# Patient Record
Sex: Female | Born: 1955 | Race: Asian | Hispanic: No | Smoking: Never smoker
Health system: Southern US, Community
[De-identification: ages and names within clinical notes are randomized; demographics above are authoritative.]

## PROBLEM LIST (undated history)

## (undated) HISTORY — PX: BREAST SURGERY: SHX581

---

## 2015-05-04 ENCOUNTER — Emergency Department (HOSPITAL_COMMUNITY): Payer: 59

## 2015-05-04 ENCOUNTER — Emergency Department (HOSPITAL_COMMUNITY)
Admission: EM | Admit: 2015-05-04 | Discharge: 2015-05-05 | Disposition: A | Discharge status: Home / Self Care | Payer: 59 | Attending: Emergency Medicine | Admitting: Emergency Medicine

## 2015-05-04 ENCOUNTER — Encounter (HOSPITAL_COMMUNITY): Payer: Self-pay | Admitting: *Deleted

## 2015-05-04 DIAGNOSIS — R51 Headache: Secondary | ICD-10-CM | POA: Insufficient documentation

## 2015-05-04 DIAGNOSIS — R110 Nausea: Secondary | ICD-10-CM | POA: Insufficient documentation

## 2015-05-04 DIAGNOSIS — R519 Headache, unspecified: Secondary | ICD-10-CM

## 2015-05-04 DIAGNOSIS — Z79899 Other long term (current) drug therapy: Secondary | ICD-10-CM | POA: Insufficient documentation

## 2015-05-04 DIAGNOSIS — R202 Paresthesia of skin: Secondary | ICD-10-CM | POA: Insufficient documentation

## 2015-05-04 MED ORDER — KETOROLAC TROMETHAMINE 60 MG/2ML IM SOLN
30.0000 mg | Freq: Once | INTRAMUSCULAR | Status: AC
  Administered 2015-05-04: 30 mg via INTRAMUSCULAR
  Filled 2015-05-04: qty 2

## 2015-05-04 MED ORDER — PROMETHAZINE HCL 25 MG/ML IJ SOLN
12.5000 mg | Freq: Once | INTRAMUSCULAR | Status: AC
  Administered 2015-05-04: 12.5 mg via INTRAVENOUS
  Filled 2015-05-04: qty 1

## 2015-05-04 MED ORDER — DIPHENHYDRAMINE HCL 50 MG/ML IJ SOLN
25.0000 mg | Freq: Once | INTRAMUSCULAR | Status: AC
  Administered 2015-05-04: 25 mg via INTRAVENOUS
  Filled 2015-05-04: qty 1

## 2015-05-04 MED ORDER — PROCHLORPERAZINE MALEATE 5 MG PO TABS: 5.0000 mg | ORAL_TABLET | Freq: Two times a day (BID) | ORAL | Status: DC | PRN

## 2015-05-04 NOTE — Discharge Instructions (Signed)
Your CT scan was normal today. Your headache is likely a migraine. Your symptoms resolved with the medications. Please follow-up with your primary care provider within one week for further evaluation and management of your symptoms. I will give you a prescription for compazine which you may take as needed for your headache.   Please obtain all of your results from medical records or have your doctors office obtain the results - share them with your doctor - you should be seen at your doctors office in the next 2 days. Call today to arrange your follow up. Take the medications as prescribed. Please review all of the medicines and only take them if you do not have an allergy to them. Please be aware that if you are taking birth control pills, taking other prescriptions, ESPECIALLY ANTIBIOTICS may make the birth control ineffective - if this is the case, either do not engage in sexual activity or use alternative methods of birth control such as condoms until you have finished the medicine and your family doctor says it is OK to restart them. If you are on a blood thinner such as COUMADIN, be aware that any other medicine that you take may cause the coumadin to either work too much, or not enough - you should have your coumadin level rechecked in next 7 days if this is the case.  ?  It is also a possibility that you have an allergic reaction to any of the medicines that you have been prescribed - Everybody reacts differently to medications and while MOST people have no trouble with most medicines, you may have a reaction such as nausea, vomiting, rash, swelling, shortness of breath. If this is the case, please stop taking the medicine immediately and contact your physician.  ?  You should return to the ER if you develop severe or worsening symptoms.

## 2015-05-04 NOTE — ED Notes (Signed)
Pt complains of pain in her left rear head radiating to her left eye for the past week. Pt states she has felt less sensation in the left side of her face for the past 2 days. Pt states she hit the back of her head on a metal shelf 2 weeks ago. Pt denies nausea/vomiting/loss of consciousness.

## 2015-05-04 NOTE — ED Provider Notes (Signed)
CSN: [id number]     Arrival date & time 05/04/15  1937 History   First MD Initiated Contact with Patient 05/04/15 2029     Chief Complaint  Patient presents with  . Headache  . Numbness    HPI   Julie Terrell is an 59 y.o. female with no significant PMH who presents to the ED for evaluation of headache. Pt speaks [place] and is accompanied by her sister who acts as Nurse, learning disability. She states that she has had a Lparietal headache that comes and goes for the past week. She states that the pain radiates to her L temple. She states that she also feels numbness and tingling in the L side of her face. She states sometimes she feels like she has blurred vision in her L eye. Denies weakness. Denies dizziness. She states she might have hit her head on a shelf at work but does not remember for sure. Denies LOC. States she has had headaches in the past and they are usually more diffuse around her head and have never been this bad. She states she has tried tylenol with no relief. Denies chest pain, SOB, fever, chills, N/V.  History reviewed. No pertinent past medical history. History reviewed. No pertinent past surgical history. No family history on file. Social History  Substance Use Topics  . Smoking status: Never Smoker   . Smokeless tobacco: None  . Alcohol Use: No   OB History    No data available     Review of Systems  All other systems reviewed and are negative.     Allergies  Review of patient's allergies indicates no known allergies.  Home Medications   Prior to Admission medications   Medication Sig Start Date End Date Taking? Authorizing Provider  aspirin-acetaminophen-caffeine (EXCEDRIN MIGRAINE) [phone] MG tablet Take 1 tablet by mouth every 6 (six) hours as needed for headache or migraine.   Yes Historical Provider, MD  cetirizine (ZYRTEC) 10 MG tablet Take 10 mg by mouth daily.   Yes Historical Provider, MD  ibuprofen (ADVIL,MOTRIN) 200 MG tablet Take 200 mg by mouth every 6  (six) hours as needed for headache or moderate pain.   Yes Historical Provider, MD   BP 141/81 mmHg  Pulse 72  Resp 18  SpO2 100% Physical Exam  Constitutional: She is oriented to person, place, and time. No distress.  HENT:  Head: Atraumatic.  Right Ear: External ear normal.  Left Ear: External ear normal.  Nose: Nose normal.  Mouth/Throat: Oropharynx is clear and moist. No oropharyngeal exudate.  Eyes: Conjunctivae and EOM are normal. Pupils are equal, round, and reactive to light.  Neck: Normal range of motion. Neck supple.  Cardiovascular: Normal rate, regular rhythm, normal heart sounds and intact distal pulses.   Pulmonary/Chest: Effort normal and breath sounds normal. No respiratory distress. She has no wheezes.  Abdominal: Soft. Bowel sounds are normal. She exhibits no distension. There is no tenderness.  Musculoskeletal: Normal range of motion. She exhibits no edema or tenderness.  Neurological: She is alert and oriented to person, place, and time. She has normal strength. She displays no tremor. She displays a negative Romberg sign. Coordination and gait normal.  Pt reports decreased sensation on L side of face. Also endorses paresthesias. Cranial nerves are otherwise intact. No facial droop.   Skin: Skin is warm and dry. She is not diaphoretic.  Psychiatric: She has a normal mood and affect.  Nursing note and vitals reviewed.   ED Course  Procedures (including  critical care time) Labs Review Labs Reviewed - No data to display  Imaging Review Ct Head Wo Contrast  05/04/2015  CLINICAL DATA:  Headaches with facial numbness. EXAM: CT HEAD WITHOUT CONTRAST TECHNIQUE: Contiguous axial images were obtained from the base of the skull through the vertex without intravenous contrast. COMPARISON:  None. FINDINGS: There is no evidence of mass effect, midline shift or extra-axial fluid collections. There is no evidence of a space-occupying lesion or intracranial hemorrhage. There is  no evidence of a cortical-based area of acute infarction. The ventricles and sulci are appropriate for the patient's age. The basal cisterns are patent. Visualized portions of the orbits are unremarkable. The visualized portions of the paranasal sinuses and mastoid air cells are unremarkable. The osseous structures are unremarkable. IMPRESSION: Normal CT of the brain without intravenous contrast. Electronically Signed   By: Elige KoHetal  Patel   On: 05/04/2015 21:17   I have personally reviewed and evaluated these images and lab results as part of my medical decision-making.   EKG Interpretation None      MDM   Final diagnoses:  Acute nonintractable headache, unspecified headache type    Will give headache cocktail. Vitals unremarkable. No meningismus. Neuro exam reveals some decreased sensation to R side of face otherwise nonfocal. I suspect migraine. However given new character of headache with new symptoms will get CT head to r/o other acute pathology.  Pt reports relief with benadryl and phenergan. CT unremarkable. PT is nontoxic, nonfocal neuro exam, VSS. Stable for discharge.  As pt was being discharged she told RN that pain was back. Will give IM toradol here. Will give rx for compazine. Instructed to f/u with pcp for further eval and management. ER return precautions given. Pt verbalized understanding.   Carlene CoriaSerena Y Lashandra Arauz, PA-C 05/05/15 1443  Lorre NickAnthony Allen, MD 05/05/15 [phone]

## 2015-05-05 NOTE — ED Notes (Signed)
Patient c/o left side blurred vision and left side headache. No neuro deficits noted.  Patient ambulating with a steady gait.  PA notified and order received for additional pain medication.

## 2017-12-24 ENCOUNTER — Other Ambulatory Visit: Payer: Self-pay | Admitting: Family Medicine

## 2017-12-24 ENCOUNTER — Ambulatory Visit
Admission: RE | Admit: 2017-12-24 | Discharge: 2017-12-24 | Disposition: A | Discharge status: Home / Self Care | Payer: BLUE CROSS/BLUE SHIELD | Source: Ambulatory Visit | Attending: Family Medicine | Admitting: Family Medicine

## 2017-12-24 DIAGNOSIS — R05 Cough: Secondary | ICD-10-CM

## 2017-12-24 DIAGNOSIS — R053 Chronic cough: Secondary | ICD-10-CM

## 2018-01-22 ENCOUNTER — Ambulatory Visit
Admission: RE | Admit: 2018-01-22 | Discharge: 2018-01-22 | Disposition: A | Discharge status: Home / Self Care | Payer: BLUE CROSS/BLUE SHIELD | Source: Ambulatory Visit | Attending: Family Medicine | Admitting: Family Medicine

## 2018-01-22 ENCOUNTER — Other Ambulatory Visit: Payer: Self-pay | Admitting: Family Medicine

## 2018-01-22 DIAGNOSIS — R9389 Abnormal findings on diagnostic imaging of other specified body structures: Secondary | ICD-10-CM

## 2018-01-22 DIAGNOSIS — R05 Cough: Secondary | ICD-10-CM

## 2018-01-22 DIAGNOSIS — R053 Chronic cough: Secondary | ICD-10-CM

## 2018-01-28 ENCOUNTER — Other Ambulatory Visit: Payer: Self-pay | Admitting: Family Medicine

## 2018-01-28 DIAGNOSIS — R9389 Abnormal findings on diagnostic imaging of other specified body structures: Secondary | ICD-10-CM

## 2018-02-06 ENCOUNTER — Ambulatory Visit
Admission: RE | Admit: 2018-02-06 | Discharge: 2018-02-06 | Disposition: A | Discharge status: Home / Self Care | Payer: BLUE CROSS/BLUE SHIELD | Source: Ambulatory Visit | Attending: Family Medicine | Admitting: Family Medicine

## 2018-02-06 DIAGNOSIS — R9389 Abnormal findings on diagnostic imaging of other specified body structures: Secondary | ICD-10-CM

## 2018-03-19 ENCOUNTER — Other Ambulatory Visit: Payer: Self-pay | Admitting: Family Medicine

## 2018-03-19 DIAGNOSIS — Z1231 Encounter for screening mammogram for malignant neoplasm of breast: Secondary | ICD-10-CM

## 2018-04-29 ENCOUNTER — Ambulatory Visit: Payer: BLUE CROSS/BLUE SHIELD

## 2019-01-30 IMAGING — CR DG CHEST 2V
2 series · 2 of 2 positions shown · non-contrast
Comparison: Chest radiograph 12/24/2017.

CLINICAL DATA: 62-year-old female with persistent cough for 3 weeks
or longer. Nonsmoker.

EXAM:
CHEST - 2 VIEW

[w chest pa]
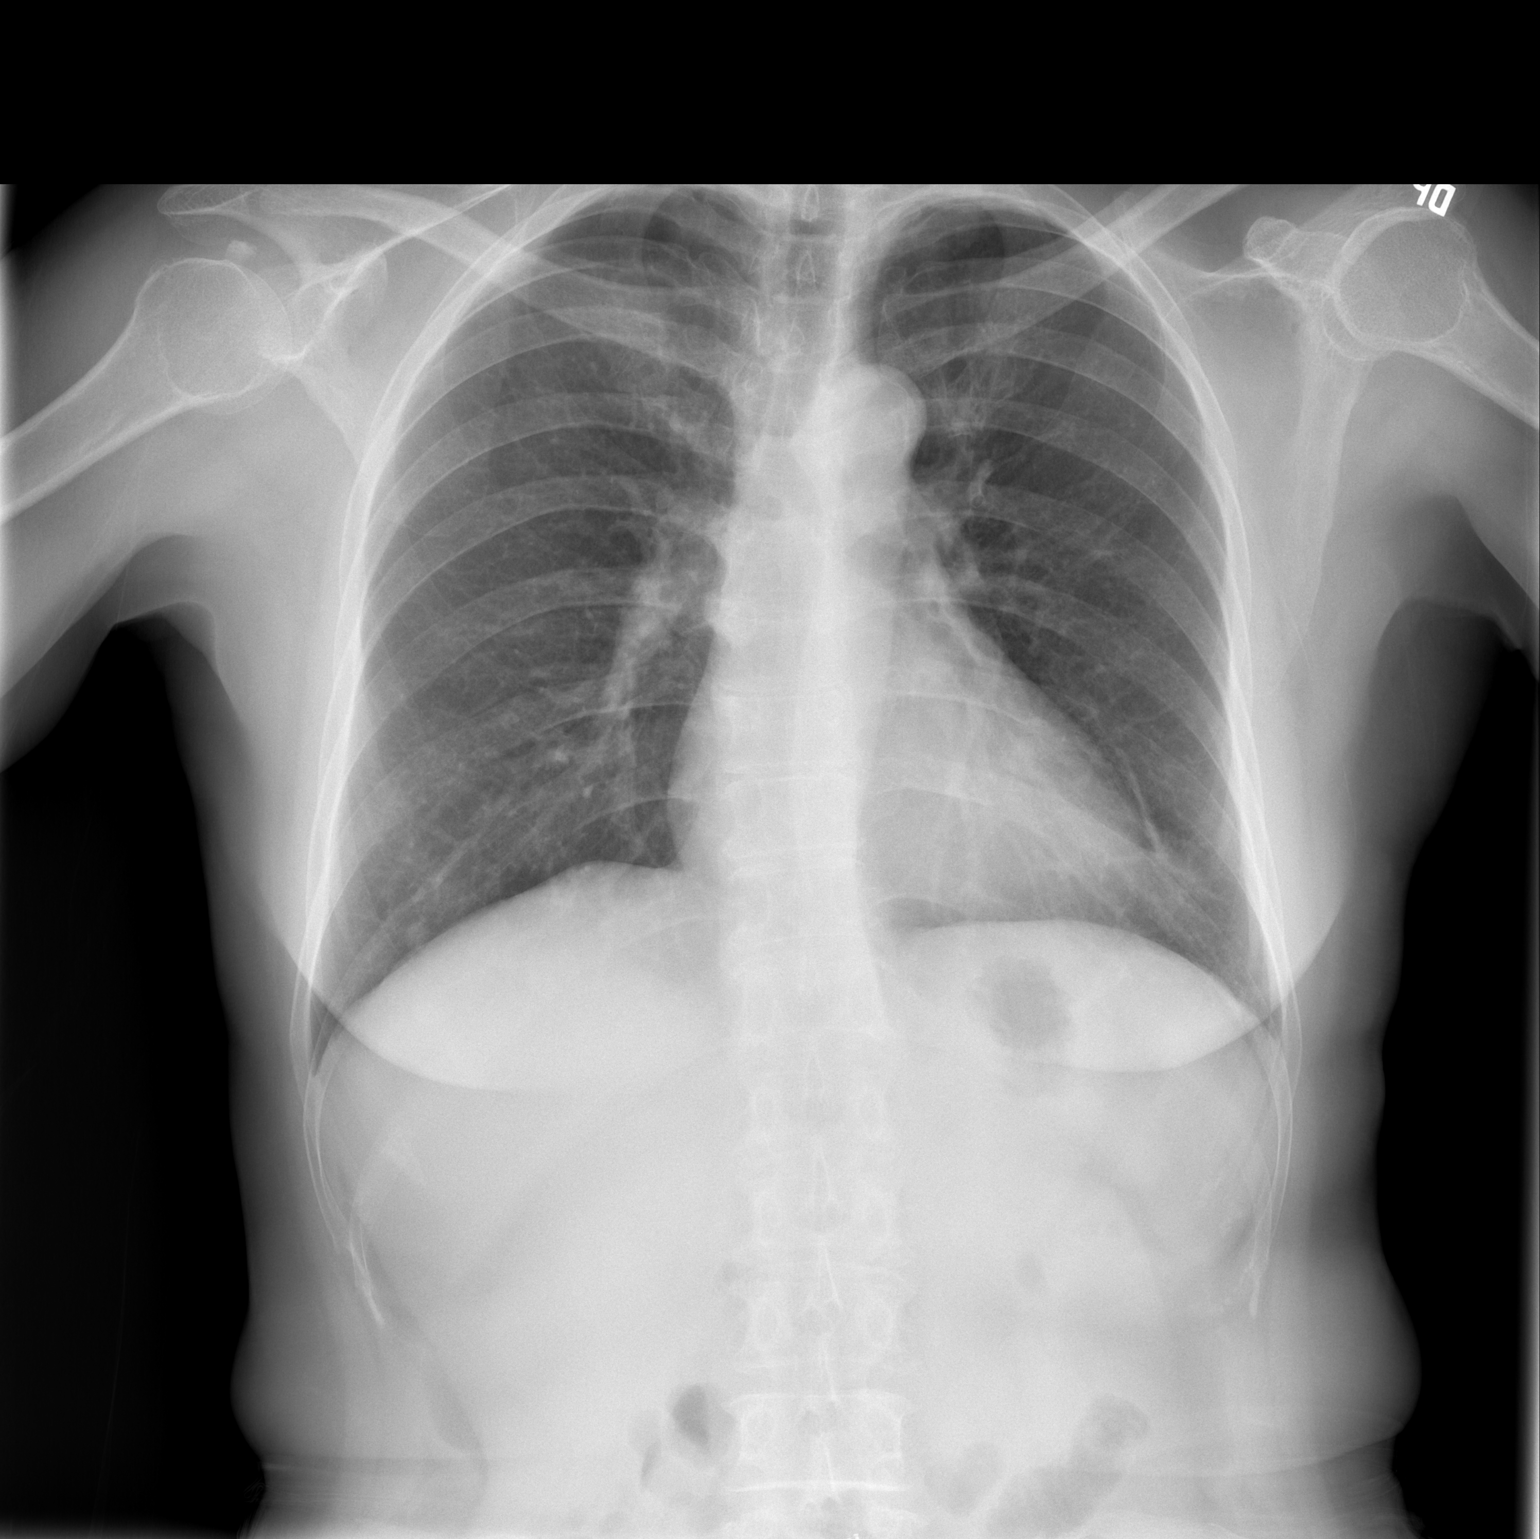

[w chest lat]
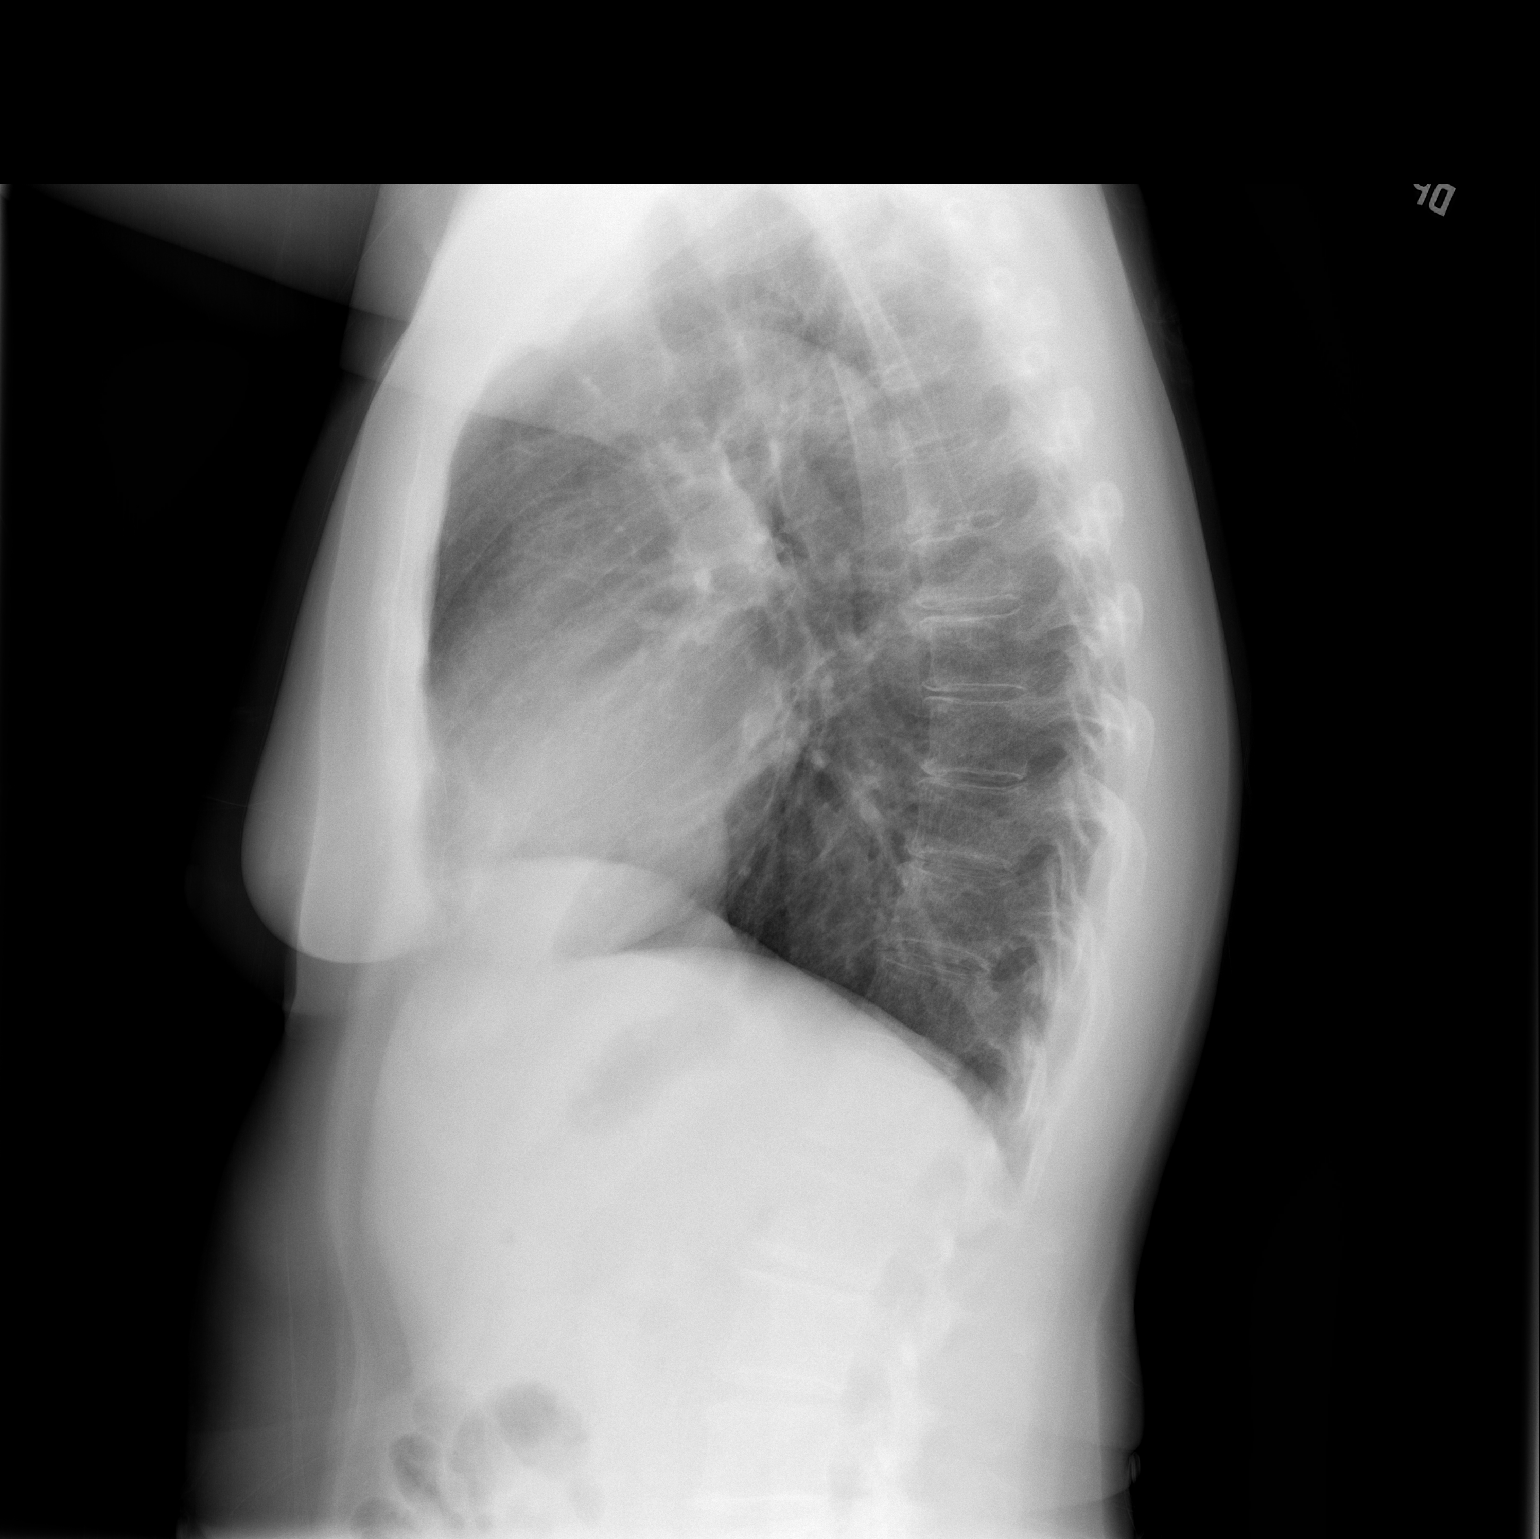

[2 of 2 positions shown; findings below may reference images not displayed]

FINDINGS: Unchanged patchy lingula and small nodular opacity at the right lung
base since [REDACTED]. Underlying lung volumes and mediastinal contours
remain normal. No superimposed pneumothorax, pulmonary edema,
pleural effusion, or new pulmonary opacity. No definite
bronchiectasis. Visualized tracheal air column is within normal
limits.

No acute osseous abnormality identified. Negative visible bowel gas
pattern.
IMPRESSION: 1. Unchanged mild nonspecific lung base opacity since [REDACTED] which may
be related to the middle lobes, raising the possibility of chronic
atypical infection in this clinical setting.
Chest CT (noncontrast should suffice) recommended to further
characterize.
2. No new cardiopulmonary abnormality.

## 2021-01-07 ENCOUNTER — Observation Stay (HOSPITAL_BASED_OUTPATIENT_CLINIC_OR_DEPARTMENT_OTHER)
Admission: EM | Admit: 2021-01-07 | Discharge: 2021-01-09 | Disposition: A | Discharge status: Home / Self Care | Payer: BLUE CROSS/BLUE SHIELD | Attending: General Surgery | Admitting: General Surgery

## 2021-01-07 ENCOUNTER — Other Ambulatory Visit: Payer: Self-pay

## 2021-01-07 ENCOUNTER — Encounter (HOSPITAL_BASED_OUTPATIENT_CLINIC_OR_DEPARTMENT_OTHER): Payer: Self-pay

## 2021-01-07 DIAGNOSIS — Z20822 Contact with and (suspected) exposure to covid-19: Secondary | ICD-10-CM | POA: Diagnosis not present

## 2021-01-07 DIAGNOSIS — K353 Acute appendicitis with localized peritonitis, without perforation or gangrene: Secondary | ICD-10-CM | POA: Diagnosis not present

## 2021-01-07 DIAGNOSIS — K3580 Unspecified acute appendicitis: Secondary | ICD-10-CM | POA: Diagnosis present

## 2021-01-07 DIAGNOSIS — R1031 Right lower quadrant pain: Secondary | ICD-10-CM | POA: Diagnosis present

## 2021-01-07 DIAGNOSIS — K37 Unspecified appendicitis: Secondary | ICD-10-CM | POA: Diagnosis present

## 2021-01-07 DIAGNOSIS — Z7982 Long term (current) use of aspirin: Secondary | ICD-10-CM | POA: Insufficient documentation

## 2021-01-07 NOTE — ED Triage Notes (Signed)
Pt is present for sharp RLQ abd pain that started this afternoon. Pt states, "When I get the sharp pain, I feel like I have to go to the bathroom but I cant". Denies N/V/D. Last BM this afternoon. Sister is assisting with interpreting, pt speaks [place].

## 2021-01-08 ENCOUNTER — Encounter (HOSPITAL_COMMUNITY)
Admission: EM | Disposition: A | Discharge status: Home / Self Care | Payer: Self-pay | Source: Home / Self Care | Attending: Emergency Medicine

## 2021-01-08 ENCOUNTER — Encounter (HOSPITAL_BASED_OUTPATIENT_CLINIC_OR_DEPARTMENT_OTHER): Payer: Self-pay | Admitting: Radiology

## 2021-01-08 ENCOUNTER — Emergency Department (HOSPITAL_COMMUNITY): Payer: BLUE CROSS/BLUE SHIELD | Admitting: Certified Registered Nurse Anesthetist

## 2021-01-08 ENCOUNTER — Emergency Department (HOSPITAL_BASED_OUTPATIENT_CLINIC_OR_DEPARTMENT_OTHER): Payer: BLUE CROSS/BLUE SHIELD

## 2021-01-08 DIAGNOSIS — Z20822 Contact with and (suspected) exposure to covid-19: Secondary | ICD-10-CM | POA: Diagnosis not present

## 2021-01-08 DIAGNOSIS — K3580 Unspecified acute appendicitis: Secondary | ICD-10-CM | POA: Diagnosis present

## 2021-01-08 DIAGNOSIS — K353 Acute appendicitis with localized peritonitis, without perforation or gangrene: Secondary | ICD-10-CM | POA: Diagnosis not present

## 2021-01-08 DIAGNOSIS — R1031 Right lower quadrant pain: Secondary | ICD-10-CM | POA: Diagnosis present

## 2021-01-08 DIAGNOSIS — Z7982 Long term (current) use of aspirin: Secondary | ICD-10-CM | POA: Diagnosis not present

## 2021-01-08 DIAGNOSIS — K37 Unspecified appendicitis: Secondary | ICD-10-CM | POA: Diagnosis present

## 2021-01-08 HISTORY — PX: LAPAROSCOPIC APPENDECTOMY: SHX408

## 2021-01-08 LAB — COMPREHENSIVE METABOLIC PANEL
ALT: 29 U/L (ref 0–44)
AST: 22 U/L (ref 15–41)
Albumin: 4.3 g/dL (ref 3.5–5.0)
Alkaline Phosphatase: 68 U/L (ref 38–126)
Anion gap: 10 (ref 5–15)
BUN: 14 mg/dL (ref 8–23)
CO2: 27 mmol/L (ref 22–32)
Calcium: 8.8 mg/dL — ABNORMAL LOW (ref 8.9–10.3)
Chloride: 101 mmol/L (ref 98–111)
Creatinine, Ser: 0.67 mg/dL (ref 0.44–1.00)
GFR, Estimated: >60 mL/min (ref >60)
Glucose, Bld: 114 mg/dL — ABNORMAL HIGH (ref 70–99)
Potassium: 3.2 mmol/L — ABNORMAL LOW (ref 3.5–5.1)
Sodium: 138 mmol/L (ref 135–145)
Total Bilirubin: 0.7 mg/dL (ref 0.3–1.2)
Total Protein: 7.4 g/dL (ref 6.5–8.1)

## 2021-01-08 LAB — URINALYSIS, ROUTINE W REFLEX MICROSCOPIC
Bilirubin Urine: NEGATIVE
Glucose, UA: NEGATIVE mg/dL
Hgb urine dipstick: NEGATIVE
Ketones, ur: NEGATIVE mg/dL
Leukocytes,Ua: NEGATIVE
Nitrite: NEGATIVE
Protein, ur: NEGATIVE mg/dL
Specific Gravity, Urine: 1.009 (ref 1.005–1.030)
pH: 5.5 (ref 5.0–8.0)

## 2021-01-08 LAB — CBC
HCT: 35.6 % — ABNORMAL LOW (ref 36.0–46.0)
Hemoglobin: 12.2 g/dL (ref 12.0–15.0)
MCH: 25.1 pg — ABNORMAL LOW (ref 26.0–34.0)
MCHC: 34.3 g/dL (ref 30.0–36.0)
MCV: 73.1 fL — ABNORMAL LOW (ref 80.0–100.0)
Platelets: 266 10*3/uL (ref 150–400)
RBC: 4.87 MIL/uL (ref 3.87–5.11)
RDW: 14.2 % (ref 11.5–15.5)
WBC: 14.9 10*3/uL — ABNORMAL HIGH (ref 4.0–10.5)
nRBC: 0 % (ref 0.0–0.2)

## 2021-01-08 LAB — RESP PANEL BY RT-PCR (FLU A&B, COVID) ARPGX2
Influenza A by PCR: NEGATIVE
Influenza B by PCR: NEGATIVE
SARS Coronavirus 2 by RT PCR: NEGATIVE

## 2021-01-08 LAB — HIV ANTIBODY (ROUTINE TESTING W REFLEX): HIV Screen 4th Generation wRfx: NONREACTIVE

## 2021-01-08 LAB — LIPASE, BLOOD: Lipase: 56 U/L — ABNORMAL HIGH (ref 11–51)

## 2021-01-08 SURGERY — APPENDECTOMY, LAPAROSCOPIC
Anesthesia: General | Site: Abdomen

## 2021-01-08 MED ORDER — ENOXAPARIN SODIUM 40 MG/0.4ML IJ SOSY
40.0000 mg | PREFILLED_SYRINGE | INTRAMUSCULAR | Status: DC
  Administered 2021-01-09: 40 mg via SUBCUTANEOUS
  Filled 2021-01-08: qty 0.4

## 2021-01-08 MED ORDER — PHENYLEPHRINE 40 MCG/ML (10ML) SYRINGE FOR IV PUSH (FOR BLOOD PRESSURE SUPPORT)
PREFILLED_SYRINGE | INTRAVENOUS | Status: DC | PRN
  Administered 2021-01-08 (×3): 80 ug via INTRAVENOUS

## 2021-01-08 MED ORDER — LIDOCAINE 2% (20 MG/ML) 5 ML SYRINGE
INTRAMUSCULAR | Status: DC | PRN
  Administered 2021-01-08: 50 mg via INTRAVENOUS

## 2021-01-08 MED ORDER — MORPHINE SULFATE (PF) 2 MG/ML IV SOLN: 1.0000 mg | INTRAVENOUS | Status: DC | PRN

## 2021-01-08 MED ORDER — BUPIVACAINE-EPINEPHRINE 0.25% -1:200000 IJ SOLN
INTRAMUSCULAR | Status: DC | PRN
  Administered 2021-01-08: 6 mL

## 2021-01-08 MED ORDER — LACTATED RINGERS IV SOLN: INTRAVENOUS | Status: DC

## 2021-01-08 MED ORDER — ACETAMINOPHEN 500 MG PO TABS
1000.0000 mg | ORAL_TABLET | Freq: Four times a day (QID) | ORAL | Status: DC
  Administered 2021-01-08 – 2021-01-09 (×4): 1000 mg via ORAL
  Filled 2021-01-08 (×5): qty 2

## 2021-01-08 MED ORDER — FENTANYL CITRATE (PF) 100 MCG/2ML IJ SOLN
INTRAMUSCULAR | Status: AC
  Filled 2021-01-08: qty 2

## 2021-01-08 MED ORDER — HEMOSTATIC AGENTS (NO CHARGE) OPTIME
TOPICAL | Status: DC | PRN
  Administered 2021-01-08: 1

## 2021-01-08 MED ORDER — FENTANYL CITRATE (PF) 100 MCG/2ML IJ SOLN
25.0000 ug | INTRAMUSCULAR | Status: DC | PRN
  Administered 2021-01-08 (×2): 25 ug via INTRAVENOUS

## 2021-01-08 MED ORDER — FENTANYL CITRATE (PF) 250 MCG/5ML IJ SOLN
INTRAMUSCULAR | Status: DC | PRN
  Administered 2021-01-08: 50 ug via INTRAVENOUS
  Administered 2021-01-08: 100 ug via INTRAVENOUS

## 2021-01-08 MED ORDER — DEXAMETHASONE SODIUM PHOSPHATE 10 MG/ML IJ SOLN
INTRAMUSCULAR | Status: DC | PRN
  Administered 2021-01-08: 10 mg via INTRAVENOUS

## 2021-01-08 MED ORDER — SODIUM CHLORIDE 0.9 % IV SOLN
1.0000 g | Freq: Once | INTRAVENOUS | Status: AC
  Administered 2021-01-08: 1 g via INTRAVENOUS
  Filled 2021-01-08: qty 10

## 2021-01-08 MED ORDER — CEFAZOLIN SODIUM-DEXTROSE 2-4 GM/100ML-% IV SOLN
2.0000 g | Freq: Once | INTRAVENOUS | Status: AC
  Administered 2021-01-08: 2 g via INTRAVENOUS
  Filled 2021-01-08 (×2): qty 100

## 2021-01-08 MED ORDER — MORPHINE SULFATE (PF) 4 MG/ML IV SOLN
4.0000 mg | Freq: Once | INTRAVENOUS | Status: AC
  Administered 2021-01-08: 4 mg via INTRAVENOUS
  Filled 2021-01-08: qty 1

## 2021-01-08 MED ORDER — ONDANSETRON HCL 4 MG/2ML IJ SOLN
INTRAMUSCULAR | Status: DC | PRN
  Administered 2021-01-08: 4 mg via INTRAVENOUS

## 2021-01-08 MED ORDER — METRONIDAZOLE 500 MG/100ML IV SOLN
500.0000 mg | Freq: Three times a day (TID) | INTRAVENOUS | Status: DC
  Administered 2021-01-08: 500 mg via INTRAVENOUS
  Filled 2021-01-08: qty 100

## 2021-01-08 MED ORDER — ACETAMINOPHEN 500 MG PO TABS
1000.0000 mg | ORAL_TABLET | Freq: Once | ORAL | Status: DC
  Filled 2021-01-08: qty 2

## 2021-01-08 MED ORDER — SODIUM CHLORIDE 0.9 % IR SOLN
Status: DC | PRN
  Administered 2021-01-08: 1000 mL

## 2021-01-08 MED ORDER — DIPHENHYDRAMINE HCL 50 MG/ML IJ SOLN: 12.5000 mg | Freq: Four times a day (QID) | INTRAMUSCULAR | Status: DC | PRN

## 2021-01-08 MED ORDER — METHOCARBAMOL 500 MG PO TABS: 500.0000 mg | ORAL_TABLET | Freq: Four times a day (QID) | ORAL | Status: DC | PRN

## 2021-01-08 MED ORDER — PROMETHAZINE HCL 25 MG/ML IJ SOLN
6.2500 mg | Freq: Once | INTRAMUSCULAR | Status: AC
  Administered 2021-01-08: 6.25 mg via INTRAVENOUS

## 2021-01-08 MED ORDER — KCL IN DEXTROSE-NACL 20-5-0.45 MEQ/L-%-% IV SOLN
INTRAVENOUS | Status: DC
  Filled 2021-01-08: qty 1000

## 2021-01-08 MED ORDER — IOHEXOL 300 MG/ML  SOLN
100.0000 mL | Freq: Once | INTRAMUSCULAR | Status: AC | PRN
  Administered 2021-01-08: 100 mL via INTRAVENOUS

## 2021-01-08 MED ORDER — 0.9 % SODIUM CHLORIDE (POUR BTL) OPTIME
TOPICAL | Status: DC | PRN
  Administered 2021-01-08: 1000 mL

## 2021-01-08 MED ORDER — SUGAMMADEX SODIUM 200 MG/2ML IV SOLN
INTRAVENOUS | Status: DC | PRN
  Administered 2021-01-08: 200 mg via INTRAVENOUS

## 2021-01-08 MED ORDER — ONDANSETRON HCL 4 MG/2ML IJ SOLN
INTRAMUSCULAR | Status: AC
  Filled 2021-01-08: qty 2

## 2021-01-08 MED ORDER — METOPROLOL TARTRATE 5 MG/5ML IV SOLN: 5.0000 mg | Freq: Four times a day (QID) | INTRAVENOUS | Status: DC | PRN

## 2021-01-08 MED ORDER — FENTANYL CITRATE (PF) 250 MCG/5ML IJ SOLN
INTRAMUSCULAR | Status: AC
  Filled 2021-01-08: qty 5

## 2021-01-08 MED ORDER — ONDANSETRON HCL 4 MG/2ML IJ SOLN: 4.0000 mg | Freq: Four times a day (QID) | INTRAMUSCULAR | Status: DC | PRN

## 2021-01-08 MED ORDER — OXYCODONE HCL 5 MG PO TABS
5.0000 mg | ORAL_TABLET | ORAL | Status: DC | PRN
  Administered 2021-01-08: 5 mg via ORAL
  Filled 2021-01-08: qty 1

## 2021-01-08 MED ORDER — ONDANSETRON 4 MG PO TBDP: 4.0000 mg | ORAL_TABLET | Freq: Four times a day (QID) | ORAL | Status: DC | PRN

## 2021-01-08 MED ORDER — DIPHENHYDRAMINE HCL 12.5 MG/5ML PO ELIX: 12.5000 mg | ORAL_SOLUTION | Freq: Four times a day (QID) | ORAL | Status: DC | PRN

## 2021-01-08 MED ORDER — ROCURONIUM BROMIDE 10 MG/ML (PF) SYRINGE
PREFILLED_SYRINGE | INTRAVENOUS | Status: DC | PRN
  Administered 2021-01-08: 60 mg via INTRAVENOUS

## 2021-01-08 MED ORDER — LIDOCAINE 2% (20 MG/ML) 5 ML SYRINGE
INTRAMUSCULAR | Status: AC
  Filled 2021-01-08: qty 5

## 2021-01-08 MED ORDER — MIDAZOLAM HCL 2 MG/2ML IJ SOLN
INTRAMUSCULAR | Status: AC
  Filled 2021-01-08: qty 2

## 2021-01-08 MED ORDER — BUPIVACAINE-EPINEPHRINE (PF) 0.25% -1:200000 IJ SOLN
INTRAMUSCULAR | Status: AC
  Filled 2021-01-08: qty 30

## 2021-01-08 MED ORDER — PROMETHAZINE HCL 25 MG/ML IJ SOLN
INTRAMUSCULAR | Status: AC
  Filled 2021-01-08: qty 1

## 2021-01-08 MED ORDER — ROCURONIUM BROMIDE 10 MG/ML (PF) SYRINGE
PREFILLED_SYRINGE | INTRAVENOUS | Status: AC
  Filled 2021-01-08: qty 10

## 2021-01-08 MED ORDER — ORAL CARE MOUTH RINSE: 15.0000 mL | Freq: Once | OROMUCOSAL | Status: AC

## 2021-01-08 MED ORDER — CHLORHEXIDINE GLUCONATE 0.12 % MT SOLN
15.0000 mL | Freq: Once | OROMUCOSAL | Status: AC
  Administered 2021-01-08: 15 mL via OROMUCOSAL
  Filled 2021-01-08: qty 15

## 2021-01-08 MED ORDER — MIDAZOLAM HCL 5 MG/5ML IJ SOLN
INTRAMUSCULAR | Status: DC | PRN
  Administered 2021-01-08: 2 mg via INTRAVENOUS

## 2021-01-08 MED ORDER — SIMETHICONE 80 MG PO CHEW: 40.0000 mg | CHEWABLE_TABLET | Freq: Four times a day (QID) | ORAL | Status: DC | PRN

## 2021-01-08 MED ORDER — MORPHINE SULFATE (PF) 4 MG/ML IV SOLN
4.0000 mg | Freq: Once | INTRAVENOUS | Status: AC
Start: 2021-01-08 — End: 2021-01-08
  Administered 2021-01-08: 4 mg via INTRAVENOUS
  Filled 2021-01-08: qty 1

## 2021-01-08 MED ORDER — IOHEXOL 9 MG/ML PO SOLN
500.0000 mL | Freq: Two times a day (BID) | ORAL | Status: DC | PRN
  Administered 2021-01-08 (×2): 500 mL via ORAL

## 2021-01-08 MED ORDER — ONDANSETRON HCL 4 MG/2ML IJ SOLN
4.0000 mg | Freq: Once | INTRAMUSCULAR | Status: AC
  Administered 2021-01-08: 4 mg via INTRAVENOUS
  Filled 2021-01-08: qty 2

## 2021-01-08 MED ORDER — PROPOFOL 10 MG/ML IV BOLUS
INTRAVENOUS | Status: DC | PRN
  Administered 2021-01-08: 100 mg via INTRAVENOUS

## 2021-01-08 MED ORDER — MELATONIN 3 MG PO TABS: 3.0000 mg | ORAL_TABLET | Freq: Every evening | ORAL | Status: DC | PRN

## 2021-01-08 SURGICAL SUPPLY — 41 items
APPLIER CLIP 5 13 M/L LIGAMAX5 (MISCELLANEOUS)
BAG COUNTER SPONGE SURGICOUNT (BAG) ×2 IMPLANT
CANISTER SUCT 3000ML PPV (MISCELLANEOUS) ×2 IMPLANT
CHLORAPREP W/TINT 26 (MISCELLANEOUS) ×2 IMPLANT
CLIP APPLIE 5 13 M/L LIGAMAX5 (MISCELLANEOUS) IMPLANT
COVER SURGICAL LIGHT HANDLE (MISCELLANEOUS) ×2 IMPLANT
CUTTER FLEX LINEAR 45M (STAPLE) ×2 IMPLANT
DERMABOND ADVANCED (GAUZE/BANDAGES/DRESSINGS) ×1
DERMABOND ADVANCED .7 DNX12 (GAUZE/BANDAGES/DRESSINGS) ×1 IMPLANT
ELECT REM PT RETURN 9FT ADLT (ELECTROSURGICAL) ×2
ELECTRODE REM PT RTRN 9FT ADLT (ELECTROSURGICAL) ×1 IMPLANT
GLOVE SURG ENC MOIS LTX SZ7 (GLOVE) ×2 IMPLANT
GLOVE SURG UNDER POLY LF SZ7.5 (GLOVE) ×2 IMPLANT
GOWN STRL REUS W/ TWL LRG LVL3 (GOWN DISPOSABLE) ×3 IMPLANT
GOWN STRL REUS W/TWL LRG LVL3 (GOWN DISPOSABLE) ×3
GRASPER SUT TROCAR 14GX15 (MISCELLANEOUS) ×2 IMPLANT
HEMOSTAT SNOW SURGICEL 2X4 (HEMOSTASIS) ×2 IMPLANT
KIT BASIN OR (CUSTOM PROCEDURE TRAY) ×2 IMPLANT
KIT TURNOVER KIT B (KITS) ×2 IMPLANT
NS IRRIG 1000ML POUR BTL (IV SOLUTION) ×2 IMPLANT
PAD ARMBOARD 7.5X6 YLW CONV (MISCELLANEOUS) ×4 IMPLANT
POUCH RETRIEVAL ECOSAC 10 (ENDOMECHANICALS) ×1 IMPLANT
POUCH RETRIEVAL ECOSAC 10MM (ENDOMECHANICALS) ×1
RELOAD 45 VASCULAR/THIN (ENDOMECHANICALS) IMPLANT
RELOAD STAPLE TA45 3.5 REG BLU (ENDOMECHANICALS) ×2 IMPLANT
SCISSORS LAP 5X35 DISP (ENDOMECHANICALS) IMPLANT
SET IRRIG TUBING LAPAROSCOPIC (IRRIGATION / IRRIGATOR) ×2 IMPLANT
SET TUBE SMOKE EVAC HIGH FLOW (TUBING) ×2 IMPLANT
SHEARS HARMONIC ACE PLUS 36CM (ENDOMECHANICALS) ×2 IMPLANT
SLEEVE ENDOPATH XCEL 5M (ENDOMECHANICALS) ×2 IMPLANT
SPECIMEN JAR SMALL (MISCELLANEOUS) ×2 IMPLANT
STRIP CLOSURE SKIN 1/2X4 (GAUZE/BANDAGES/DRESSINGS) ×2 IMPLANT
SUT MNCRL AB 4-0 PS2 18 (SUTURE) ×2 IMPLANT
SUT VICRYL 0 UR6 27IN ABS (SUTURE) ×2 IMPLANT
TOWEL GREEN STERILE (TOWEL DISPOSABLE) ×2 IMPLANT
TOWEL GREEN STERILE FF (TOWEL DISPOSABLE) ×2 IMPLANT
TRAY FOLEY MTR SLVR 16FR STAT (SET/KITS/TRAYS/PACK) IMPLANT
TRAY LAPAROSCOPIC MC (CUSTOM PROCEDURE TRAY) ×2 IMPLANT
TROCAR XCEL BLUNT TIP 100MML (ENDOMECHANICALS) ×2 IMPLANT
TROCAR XCEL NON-BLD 5MMX100MML (ENDOMECHANICALS) ×2 IMPLANT
WATER STERILE IRR 1000ML POUR (IV SOLUTION) ×2 IMPLANT

## 2021-01-08 NOTE — H&P (Signed)
Julie Terrell is an 65 y.o. female.   Chief Complaint: ab pain HPI: 29 yof otherwise healthy with rlq pain since last night.  She is fine with her son translating.  Pain sharp, nothing making it better, no n/v.  No fevers.  She had elevated wbc and ct with appendicitis at outside facility.   History reviewed. No pertinent past medical history.  Past Surgical History:  Procedure Laterality Date   BREAST SURGERY Left    removal of cyst    No family history on file. Social History:  reports that she has never smoked. She has never used smokeless tobacco. She reports that she does not drink alcohol. No history on file for drug use.  Allergies: No Known Allergies  Medications Prior to Admission  Medication Sig Dispense Refill   aspirin-acetaminophen-caffeine (EXCEDRIN MIGRAINE) 250-250-65 MG tablet Take 1 tablet by mouth every 6 (six) hours as needed for headache or migraine.     cetirizine (ZYRTEC) 10 MG tablet Take 10 mg by mouth daily.     ibuprofen (ADVIL,MOTRIN) 200 MG tablet Take 200 mg by mouth every 6 (six) hours as needed for headache or moderate pain.     prochlorperazine (COMPAZINE) 5 MG tablet Take 1 tablet (5 mg total) by mouth 2 (two) times daily as needed. 20 tablet 0    Results for orders placed or performed during the hospital encounter of 01/07/21 (from the past 48 hour(s))  Lipase, blood     Status: Abnormal   Collection Time: 01/07/21 11:48 PM  Result Value Ref Range   Lipase 56 (H) 11 - 51 U/L    Comment: Performed at [facility], [address] [place], [place] [address]  Comprehensive metabolic panel     Status: Abnormal   Collection Time: 01/07/21 11:48 PM  Result Value Ref Range   Sodium 138 135 - 145 mmol/L   Potassium 3.2 (L) 3.5 - 5.1 mmol/L   Chloride 101 98 - 111 mmol/L   CO2 27 22 - 32 mmol/L   Glucose, Bld 114 (H) 70 - 99 mg/dL    Comment: Glucose reference range applies only to samples taken after fasting for at least 8  hours.   BUN 14 8 - 23 mg/dL   Creatinine, Ser 7.54 0.44 - 1.00 mg/dL   Calcium 8.8 (L) 8.9 - 10.3 mg/dL   Total Protein 7.4 6.5 - 8.1 g/dL   Albumin 4.3 3.5 - 5.0 g/dL   AST 22 15 - 41 U/L   ALT 29 0 - 44 U/L   Alkaline Phosphatase 68 38 - 126 U/L   Total Bilirubin 0.7 0.3 - 1.2 mg/dL   GFR, Estimated >49 >20 mL/min    Comment: (NOTE) Calculated using the CKD-EPI Creatinine Equation (2021)    Anion gap 10 5 - 15    Comment: Performed at [facility], [address] [place], [place] [address]  CBC     Status: Abnormal   Collection Time: 01/07/21 11:48 PM  Result Value Ref Range   WBC 14.9 (H) 4.0 - 10.5 K/uL   RBC 4.87 3.87 - 5.11 MIL/uL   Hemoglobin 12.2 12.0 - 15.0 g/dL   HCT 21.9 (L) 75.8 - 83.2 %   MCV 73.1 (L) 80.0 - 100.0 fL   MCH 25.1 (L) 26.0 - 34.0 pg   MCHC 34.3 30.0 - 36.0 g/dL   RDW 54.9 82.6 - 41.5 %   Platelets 266 150 - 400 K/uL   nRBC 0.0 0.0 -  0.2 %    Comment: Performed at [facility], [address], [place], [place] [address]  Urinalysis, Routine w reflex microscopic     Status: Abnormal   Collection Time: 01/07/21 11:48 PM  Result Value Ref Range   Color, Urine COLORLESS (A) YELLOW   APPearance CLEAR CLEAR   Specific Gravity, Urine 1.009 1.005 - 1.030   pH 5.5 5.0 - 8.0   Glucose, UA NEGATIVE NEGATIVE mg/dL   Hgb urine dipstick NEGATIVE NEGATIVE   Bilirubin Urine NEGATIVE NEGATIVE   Ketones, ur NEGATIVE NEGATIVE mg/dL   Protein, ur NEGATIVE NEGATIVE mg/dL   Nitrite NEGATIVE NEGATIVE   Leukocytes,Ua NEGATIVE NEGATIVE    Comment: Performed at [facility], [address] [place], [place] [address]  Resp Panel by RT-PCR (Flu A&B, Covid) Nasopharyngeal Swab     Status: None   Collection Time: 01/08/21  1:16 AM   Specimen: Nasopharyngeal Swab; Nasopharyngeal(NP) swabs in vial transport medium  Result Value Ref Range   SARS Coronavirus 2 by RT PCR NEGATIVE NEGATIVE    Comment:  (NOTE) SARS-CoV-2 target nucleic acids are NOT DETECTED.  The SARS-CoV-2 RNA is generally detectable in upper respiratory specimens during the acute phase of infection. The lowest concentration of SARS-CoV-2 viral copies this assay can detect is 138 copies/mL. A negative result does not preclude SARS-Cov-2 infection and should not be used as the sole basis for treatment or other patient management decisions. A negative result may occur with  improper specimen collection/handling, submission of specimen other than nasopharyngeal swab, presence of viral mutation(s) within the areas targeted by this assay, and inadequate number of viral copies(<138 copies/mL). A negative result must be combined with clinical observations, patient history, and epidemiological information. The expected result is Negative.  Fact Sheet for Patients:  BloggerCourse.com  Fact Sheet for Healthcare Providers:  SeriousBroker.it  This test is no t yet approved or cleared by the [place] FDA and  has been authorized for detection and/or diagnosis of SARS-CoV-2 by FDA under an Emergency Use Authorization (EUA). This EUA will remain  in effect (meaning this test can be used) for the duration of the COVID-19 declaration under Section 564(b)(1) of the Act, 21 U.S.C.section 360bbb-3(b)(1), unless the authorization is terminated  or revoked sooner.       Influenza A by PCR NEGATIVE NEGATIVE   Influenza B by PCR NEGATIVE NEGATIVE    Comment: (NOTE) The Xpert Xpress SARS-CoV-2/FLU/RSV plus assay is intended as an aid in the diagnosis of influenza from Nasopharyngeal swab specimens and should not be used as a sole basis for treatment. Nasal washings and aspirates are unacceptable for Xpert Xpress SARS-CoV-2/FLU/RSV testing.  Fact Sheet for Patients: BloggerCourse.com  Fact Sheet for Healthcare  Providers: SeriousBroker.it  This test is not yet approved or cleared by the [place] FDA and has been authorized for detection and/or diagnosis of SARS-CoV-2 by FDA under an Emergency Use Authorization (EUA). This EUA will remain in effect (meaning this test can be used) for the duration of the COVID-19 declaration under Section 564(b)(1) of the Act, 21 U.S.C. section 360bbb-3(b)(1), unless the authorization is terminated or revoked.  Performed at [facility], [address] [place], [place] [address]    CT ABDOMEN PELVIS W CONTRAST  Addendum Date: 01/08/2021   ADDENDUM REPORT: 01/08/2021 03:41 ADDENDUM: Correction to lower chest findings. 7 mm pulmonary nodule at the right lung base which is unchanged and therefore felt benign. Electronically Signed   By: Adrian Prows.D.  On: 01/08/2021 03:41   Result Date: 01/08/2021 CLINICAL DATA:  Right lower quadrant pain EXAM: CT ABDOMEN AND PELVIS WITH CONTRAST TECHNIQUE: Multidetector CT imaging of the abdomen and pelvis was performed using the standard protocol following bolus administration of intravenous contrast. CONTRAST:  OMNIPAQUE IOHEXOL 300 MG/ML  SOLN COMPARISON:  None. FINDINGS: Lower chest: No acute abnormality. Hepatobiliary: No focal liver abnormality is seen. No gallstones, gallbladder wall thickening, or biliary dilatation. Pancreas: Unremarkable. No pancreatic ductal dilatation or surrounding inflammatory changes. Spleen: Normal in size without focal abnormality. Adrenals/Urinary Tract: Adrenal glands are unremarkable. Kidneys are normal, without renal calculi, focal lesion, or hydronephrosis. Bladder is unremarkable. Stomach/Bowel: The stomach is nonenlarged. No dilated small bowel. Slight thickening of the pylorus. No colon wall thickening. Abnormal appendix measuring up to 13 mm. Considerable periappendiceal soft tissue stranding. No extraluminal gas. Vascular/Lymphatic:  Nonaneurysmal aorta.  No suspicious nodes. Reproductive: Possible small fibroid at the uterine fundus. No adnexal mass. Other: Negative for free air or free fluid Musculoskeletal: No acute or significant osseous findings. IMPRESSION: 1. Findings consistent with acute appendicitis. No definitive perforation or abscess. Appendix: Location: Right lower quadrant Diameter: 13 mm Appendicolith: Negative Mucosal hyper-enhancement: Positive Extraluminal gas: Negative Periappendiceal collection: Negative Electronically Signed: By: Jasmine Pang M.D. On: 01/08/2021 01:11    Review of Systems  Constitutional:  Negative for fever.  Gastrointestinal:  Positive for abdominal pain.  All other systems reviewed and are negative.  Blood pressure 132/69, pulse 67, temperature 97.7 F (36.5 C), temperature source Oral, resp. rate 16, height 4' 11.06" (1.5 m), weight 60.2 kg, SpO2 100 %. Physical Exam Constitutional:      Appearance: She is well-developed.  Eyes:     General: No scleral icterus. Cardiovascular:     Rate and Rhythm: Normal rate.  Pulmonary:     Effort: Pulmonary effort is normal.  Abdominal:     General: Bowel sounds are normal.     Palpations: Abdomen is soft.     Tenderness: There is abdominal tenderness in the right lower quadrant.     Hernia: No hernia is present.  Skin:    General: Skin is warm and dry.     Capillary Refill: Capillary refill takes less than 2 seconds.  Neurological:     General: No focal deficit present.     Mental Status: She is alert.  Psychiatric:        Mood and Affect: Mood normal.        Behavior: Behavior normal.     Assessment/Plan Appendicitis -discussed recommendation for lap appy -discussed surgery, recovery and risks -will proceed today  Emelia Loron, MD 01/08/2021, 9:33 AM

## 2021-01-08 NOTE — ED Provider Notes (Signed)
[facility] EMERGENCY DEPT Provider Note   CSN: [id number] Arrival date & time: 01/07/21  2320     History Chief Complaint  Patient presents with  . Abdominal Pain    Julie Terrell is a 65 y.o. female.  HPI     This is a 65 year old female with no reported past medical history who presents with right lower quadrant pain.  Sister provides most of the history and translate as I am unable to get a medical interpreter.  States that she started having right lower quadrant pain this afternoon for the last 7 to 8 hours.  It is sharp.  She states she feels like she has to go to the restroom but she cannot.  She states that it is too sharp to touch at the moment.  No nausea, vomiting, diarrhea.  She has not had any fevers.  She rates her pain at 8 out of 10.  History reviewed. No pertinent past medical history.  There are no problems to display for this patient.   History reviewed. No pertinent surgical history.   OB History   No obstetric history on file.     No family history on file.  Social History   Tobacco Use  . Smoking status: Never  . Smokeless tobacco: Never  Substance Use Topics  . Alcohol use: No    Home Medications Prior to Admission medications   Medication Sig Start Date End Date Taking? Authorizing Provider  aspirin-acetaminophen-caffeine (EXCEDRIN MIGRAINE) [phone] MG tablet Take 1 tablet by mouth every 6 (six) hours as needed for headache or migraine.    [provider]  cetirizine (ZYRTEC) 10 MG tablet Take 10 mg by mouth daily.    [provider]  ibuprofen (ADVIL,MOTRIN) 200 MG tablet Take 200 mg by mouth every 6 (six) hours as needed for headache or moderate pain.    [provider]  prochlorperazine (COMPAZINE) 5 MG tablet Take 1 tablet (5 mg total) by mouth 2 (two) times daily as needed. 05/04/15   Carlene Coria, PA-C    Allergies    Patient has no known allergies.  Review of Systems   Review of  Systems  Constitutional:  Negative for fever.  Respiratory:  Negative for shortness of breath.   Cardiovascular:  Negative for chest pain.  Gastrointestinal:  Positive for abdominal pain. Negative for diarrhea, nausea and vomiting.  Genitourinary:  Negative for dysuria.  Neurological:  Negative for headaches.  All other systems reviewed and are negative.  Physical Exam Updated Vital Signs BP (!) 147/75 (BP Location: Right Arm)   Pulse 93   Temp 98.2 F (36.8 C) (Oral)   Resp 18   Ht 1.5 m (4' 11.06")   Wt 60.2 kg   SpO2 99%   BMI 26.76 kg/m   Physical Exam Vitals and nursing note reviewed.  Constitutional:      Appearance: She is well-developed. She is not ill-appearing.  HENT:     Head: Normocephalic and atraumatic.     Mouth/Throat:     Mouth: Mucous membranes are moist.  Eyes:     Pupils: Pupils are equal, round, and reactive to light.  Cardiovascular:     Rate and Rhythm: Normal rate and regular rhythm.     Heart sounds: Normal heart sounds.  Pulmonary:     Effort: Pulmonary effort is normal. No respiratory distress.     Breath sounds: No wheezing.  Abdominal:     General: Bowel sounds are normal.  Palpations: Abdomen is soft.     Tenderness: There is abdominal tenderness in the right lower quadrant. There is guarding. There is no rebound.  Musculoskeletal:     Cervical back: Neck supple.  Skin:    General: Skin is warm and dry.  Neurological:     Mental Status: She is alert and oriented to person, place, and time.  Psychiatric:        Mood and Affect: Mood normal.    ED Results / Procedures / Treatments   Labs (all labs ordered are listed, but only abnormal results are displayed) Labs Reviewed  LIPASE, BLOOD - Abnormal; Notable for the following components:      Result Value   Lipase 56 (*)    All other components within normal limits  COMPREHENSIVE METABOLIC PANEL - Abnormal; Notable for the following components:   Potassium 3.2 (*)    Glucose,  Bld 114 (*)    Calcium 8.8 (*)    All other components within normal limits  CBC - Abnormal; Notable for the following components:   WBC 14.9 (*)    HCT 35.6 (*)    MCV 73.1 (*)    MCH 25.1 (*)    All other components within normal limits  URINALYSIS, ROUTINE W REFLEX MICROSCOPIC - Abnormal; Notable for the following components:   Color, Urine COLORLESS (*)    All other components within normal limits  RESP PANEL BY RT-PCR (FLU A&B, COVID) ARPGX2    EKG None  Radiology CT ABDOMEN PELVIS W CONTRAST  Result Date: 01/08/2021 CLINICAL DATA:  Right lower quadrant pain EXAM: CT ABDOMEN AND PELVIS WITH CONTRAST TECHNIQUE: Multidetector CT imaging of the abdomen and pelvis was performed using the standard protocol following bolus administration of intravenous contrast. CONTRAST:  OMNIPAQUE IOHEXOL 300 MG/ML  SOLN COMPARISON:  None. FINDINGS: Lower chest: No acute abnormality. Hepatobiliary: No focal liver abnormality is seen. No gallstones, gallbladder wall thickening, or biliary dilatation. Pancreas: Unremarkable. No pancreatic ductal dilatation or surrounding inflammatory changes. Spleen: Normal in size without focal abnormality. Adrenals/Urinary Tract: Adrenal glands are unremarkable. Kidneys are normal, without renal calculi, focal lesion, or hydronephrosis. Bladder is unremarkable. Stomach/Bowel: The stomach is nonenlarged. No dilated small bowel. Slight thickening of the pylorus. No colon wall thickening. Abnormal appendix measuring up to 13 mm. Considerable periappendiceal soft tissue stranding. No extraluminal gas. Vascular/Lymphatic: Nonaneurysmal aorta.  No suspicious nodes. Reproductive: Possible small fibroid at the uterine fundus. No adnexal mass. Other: Negative for free air or free fluid Musculoskeletal: No acute or significant osseous findings. IMPRESSION: 1. Findings consistent with acute appendicitis. No definitive perforation or abscess. Appendix: Location: Right lower quadrant  Diameter: 13 mm Appendicolith: Negative Mucosal hyper-enhancement: Positive Extraluminal gas: Negative Periappendiceal collection: Negative Electronically Signed   By: Jasmine Pang M.D.   On: 01/08/2021 01:11    Procedures Procedures   Medications Ordered in ED Medications  morphine 4 MG/ML injection 4 mg (has no administration in time range)  ondansetron (ZOFRAN) injection 4 mg (has no administration in time range)  iohexol (OMNIPAQUE) 9 MG/ML oral solution 500 mL (500 mLs Oral Contrast Given 01/08/21 0028)  iohexol (OMNIPAQUE) 300 MG/ML solution 100 mL (100 mLs Intravenous Contrast Given 01/08/21 0059)    ED Course  I have reviewed the triage vital signs and the nursing notes.  Pertinent labs & imaging results that were available during my care of the patient were reviewed by me and considered in my medical decision making (see chart for details).  Clinical  Course as of 01/08/21 0238  Mon Jan 08, 2021  8101 Spoke with Dr. Bedelia Person.  Flagyl and Rocephin ordered.  Patient to be admitted to general surgery service. [CH]    Clinical Course User Index [CH] Annalei Friesz, Mayer Masker, MD   MDM Rules/Calculators/A&P                           Patient presents with right lower quadrant pain.  Unfortunately do not have interpreter services but sister speaks good [place].  She is nontoxic and afebrile.  She is exquisitely tender localized in the right lower quadrant with localized peritonitis.  Patient was given pain and nausea medication.  She has a leukocytosis to 14.  Considerations include but not limited to, acute appendicitis, UTI, kidney stone.  CT scan obtained.  CT scan is consistent with acute appendicitis.  No evidence of perforation or abscess.  Will discuss with general surgery.  COVID testing was added.  Patient and her sister were updated.    Final Clinical Impression(s) / ED Diagnoses Final diagnoses:  Acute appendicitis with localized peritonitis, without perforation, abscess, or  gangrene    Rx / DC Orders ED Discharge Orders     None        Shon Baton, MD 01/08/21 [id number]

## 2021-01-08 NOTE — Anesthesia Procedure Notes (Signed)
Procedure Name: Intubation Date/Time: 01/08/2021 11:00 AM Performed by: Nils Pyle, CRNA Pre-anesthesia Checklist: Patient identified, Emergency Drugs available, Suction available and Patient being monitored Patient Re-evaluated:Patient Re-evaluated prior to induction Oxygen Delivery Method: Circle System Utilized Preoxygenation: Pre-oxygenation with 100% oxygen Induction Type: IV induction Ventilation: Mask ventilation without difficulty Laryngoscope Size: Miller and 2 Grade View: Grade I Tube type: Oral Tube size: 7.0 mm Number of attempts: 1 Airway Equipment and Method: Stylet and Oral airway Placement Confirmation: ETT inserted through vocal cords under direct vision, positive ETCO2 and breath sounds checked- equal and bilateral Secured at: 22 cm Tube secured with: Tape Dental Injury: Teeth and Oropharynx as per pre-operative assessment

## 2021-01-08 NOTE — Transfer of Care (Signed)
Immediate Anesthesia Transfer of Care Note  Patient: Julie Terrell  Procedure(s) Performed: APPENDECTOMY LAPAROSCOPIC (Abdomen)  Patient Location: PACU  Anesthesia Type:General  Level of Consciousness: awake and drowsy  Airway & Oxygen Therapy: Patient Spontanous Breathing  Post-op Assessment: Report given to RN, Post -op Vital signs reviewed and stable and Patient moving all extremities X 4  Post vital signs: Reviewed and stable  Last Vitals:  Vitals Value Taken Time  BP 130/72 01/08/21 1226  Temp    Pulse 81 01/08/21 1227  Resp 14 01/08/21 1227  SpO2 98 % 01/08/21 1227  Vitals shown include unvalidated device data.  Last Pain:  Vitals:   01/08/21 0915  TempSrc: Oral  PainSc:          Complications: No notable events documented.

## 2021-01-08 NOTE — Op Note (Signed)
Preoperative diagnosis acute appendicitis Postoperative diagnosis: acute appendicitis, possible mass Procedure: 1.  Laparoscopic lysis of adhesions x30 minutes 2.  Laparoscopic appendectomy Surgeon: Dr. Harden Mo Estimated blood loss: 50 cc Anesthesia: General Specimens: Appendix to pathology Complications: None Drains: None Sponge needle count was correct completion Disposition recovery stable condition  Indications: This is a 65 year old otherwise healthy female who presents with less than 24 hours of right lower quadrant pain.  Her white blood cell count is elevated, she has focal right lower quadrant tenderness, and she has a CT scan consistent with appendicitis with a 13 mm appendix.  We discussed proceeding to the operating room for laparoscopic appendectomy.  Procedure: After informed consent was obtained via her son acting as the interpreter per her choice we went to the operating room.  She was given another dose of antibiotics.  SCDs were in place.  She was placed under general anesthesia without complication.  She was prepped and draped in the standard sterile surgical fashion.  I filtrated Marcaine and made a vertical incision below the umbilicus.  I grasped the fascia.  I incised this sharply.  I entered the peritoneum bluntly.  I placed a 0 Vicryl pursestring suture and inserted a Hassan trocar.  I then insufflated the abdomen to 15 mmHg pressure.  I inserted 2 further 5 mm trochars in the suprapubic region and left lower quadrant under direct vision without complication.  She was noted to have dense adhesions throughout her upper abdomen as well as her right lower quadrant.  I spent about 30 minutes even though she had no prior surgery lysing adhesions where her small bowel and her right colon were adherent to the abdominal wall.  I did this without any evidence of injury.  This was done with scissors and without any energy.  Once I done this I was eventually able to view what  appeared to be the cecum and the terminal ileum.  The appendix was noted to be very dilated and thickened.  This could be consistent with acute appendicitis although I certainly was concerned there was another process going on.  I cannot definitively identify a mass at this point.  I was able to rotate the right colon by taking down the white line of Toldt.  Eventually I was able to get the appendix out of the pelvis with some difficulty.  I then took down the appendiceal mesentery with a combination of blunt dissection as well as the harmonic scalpel.  I was then able to separate the terminal ileum from the cecum.  I did clearly get on the cecum.  I then used the GIA stapler to divide this including the appendix and a small cuff of the cecum.  I then placed this in a retrieval bag and removed from the abdomen.  I then stop some bleeding on the staple line using cautery.  There was some bleeding at the mesentery that I stopped up with the harmonic scalpel as well.  I also placed a piece of snow in this area as the entire area was oozing where I had had remove this from the pelvis.  This did look hemostatic upon completion.  I elected to stop here and await the pathology from this procedure.  I then remove the [facility] trocar and tied my pursestring down.  I placed an additional Vicryl suture using the suture passer device.  I then removed the remaining trochars after desufflated the abdomen.  I then closed these with 4 Monocryl and glue.  She tolerated this well was extubated transferred to recovery stable.

## 2021-01-08 NOTE — ED Notes (Signed)
Report called to Susa Raring, at short stay, [facility]

## 2021-01-08 NOTE — ED Notes (Signed)
Report called to Julie Terrell with [facility]. 

## 2021-01-08 NOTE — ED Notes (Signed)
Pt in CT.

## 2021-01-08 NOTE — Anesthesia Preprocedure Evaluation (Addendum)
Anesthesia Evaluation  Patient identified by MRN, date of birth, ID band Patient awake    Reviewed: Allergy & Precautions, NPO status , Patient's Chart, lab work & pertinent test results  Airway Mallampati: III  TM Distance: >3 FB Neck ROM: Full  Mouth opening: Limited Mouth Opening  Dental no notable dental hx. (+) Missing, Chipped, Dental Advisory Given,    Pulmonary neg pulmonary ROS,    Pulmonary exam normal breath sounds clear to auscultation       Cardiovascular negative cardio ROS Normal cardiovascular exam Rhythm:Regular Rate:Normal     Neuro/Psych  Headaches, negative psych ROS   GI/Hepatic negative GI ROS, Neg liver ROS,   Endo/Other  negative endocrine ROS  Renal/GU negative Renal ROS  negative genitourinary   Musculoskeletal negative musculoskeletal ROS (+)   Abdominal   Peds  Hematology negative hematology ROS (+)   Anesthesia Other Findings   Reproductive/Obstetrics                            Anesthesia Physical Anesthesia Plan  ASA: 1  Anesthesia Plan: General   Post-op Pain Management:    Induction: Intravenous  PONV Risk Score and Plan: 3 and Midazolam, Dexamethasone and Ondansetron  Airway Management Planned: Oral ETT  Additional Equipment:   Intra-op Plan:   Post-operative Plan: Extubation in OR  Informed Consent: I have reviewed the patients History and Physical, chart, labs and discussed the procedure including the risks, benefits and alternatives for the proposed anesthesia with the patient or authorized representative who has indicated his/her understanding and acceptance.     Dental advisory given  Plan Discussed with: CRNA  Anesthesia Plan Comments:         Anesthesia Quick Evaluation

## 2021-01-08 NOTE — Discharge Instructions (Signed)
[facility] ______[facility] LAPAROSCOPIC SURGERY: POST OP INSTRUCTIONS Always review your discharge instruction sheet given to you by the facility where your surgery was performed. IF YOU HAVE DISABILITY OR FAMILY LEAVE FORMS, YOU MUST BRING THEM TO THE OFFICE FOR PROCESSING.   DO NOT GIVE THEM TO YOUR DOCTOR.  A prescription for pain medication may be given to you upon discharge.  Take your pain medication as prescribed, if needed.  If narcotic pain medicine is not needed, then you may take acetaminophen (Tylenol) or ibuprofen (Advil) as needed. Take your usually prescribed medications unless otherwise directed. If you need a refill on your pain medication, please contact your pharmacy.  They will contact our office to request authorization. Prescriptions will not be filled after 5pm or on week-ends. You should follow a light diet the first few days after arrival home, such as soup and crackers, etc.  Be sure to include lots of fluids daily. Most patients will experience some swelling and bruising in the area of the incisions.  Ice packs will help.  Swelling and bruising can take several days to resolve.  It is common to experience some constipation if taking pain medication after surgery.  Increasing fluid intake and taking a stool softener (such as Colace) will usually help or prevent this problem from occurring.  A mild laxative (Milk of Magnesia or Miralax) should be taken according to package instructions if there are no bowel movements after 48 hours. Unless discharge instructions indicate otherwise, you may remove your bandages 24-48 hours after surgery, and you may shower at that time.  You may have steri-strips (small skin tapes) in place directly over the incision.  These strips should be left on the skin for 7-10 days.  If your surgeon used skin glue on the incision, you may shower in 24 hours.  The glue will flake off over the next 2-3 weeks.  Any sutures or staples will be  removed at the office during your follow-up visit. ACTIVITIES:  You may resume regular (light) daily activities beginning the next day--such as daily self-care, walking, climbing stairs--gradually increasing activities as tolerated.  You may have sexual intercourse when it is comfortable.  Refrain from any heavy lifting or straining until approved by your doctor. You may drive when you are no longer taking prescription pain medication, you can comfortably wear a seatbelt, and you can safely maneuver your car and apply brakes. RETURN TO WORK:  __________________________________________________________ You should see your doctor in the office for a follow-up appointment approximately 2-3 weeks after your surgery.  Make sure that you call for this appointment within a day or two after you arrive home to insure a convenient appointment time. OTHER INSTRUCTIONS: __________________________________________________________________________________________________________________________ __________________________________________________________________________________________________________________________ WHEN TO CALL YOUR DOCTOR: Fever over 101.0 Inability to urinate Continued bleeding from incision. Increased pain, redness, or drainage from the incision. Increasing abdominal pain  The clinic staff is available to answer your questions during regular business hours.  Please don't hesitate to call and ask to speak to one of the nurses for clinical concerns.  If you have a medical emergency, go to the nearest emergency room or call 911.  A surgeon from [facility] is always on call at the hospital. [address], [address], [place], [place]  [address] ? [address] [address], [place], [place]   [address] [phone] ? [phone] ? FAX [phone] Web site: [email or web address]  

## 2021-01-09 ENCOUNTER — Encounter (HOSPITAL_COMMUNITY): Payer: Self-pay | Admitting: General Surgery

## 2021-01-09 ENCOUNTER — Encounter (HOSPITAL_COMMUNITY): Payer: Self-pay

## 2021-01-09 LAB — CBC
HCT: 30 % — ABNORMAL LOW (ref 36.0–46.0)
Hemoglobin: 10.3 g/dL — ABNORMAL LOW (ref 12.0–15.0)
MCH: 25.2 pg — ABNORMAL LOW (ref 26.0–34.0)
MCHC: 34.3 g/dL (ref 30.0–36.0)
MCV: 73.3 fL — ABNORMAL LOW (ref 80.0–100.0)
Platelets: 239 10*3/uL (ref 150–400)
RBC: 4.09 MIL/uL (ref 3.87–5.11)
RDW: 14.1 % (ref 11.5–15.5)
WBC: 12.5 10*3/uL — ABNORMAL HIGH (ref 4.0–10.5)
nRBC: 0 % (ref 0.0–0.2)

## 2021-01-09 LAB — BASIC METABOLIC PANEL
Anion gap: 8 (ref 5–15)
BUN: 7 mg/dL — ABNORMAL LOW (ref 8–23)
CO2: 24 mmol/L (ref 22–32)
Calcium: 8.2 mg/dL — ABNORMAL LOW (ref 8.9–10.3)
Chloride: 103 mmol/L (ref 98–111)
Creatinine, Ser: 0.6 mg/dL (ref 0.44–1.00)
GFR, Estimated: >60 mL/min (ref >60)
Glucose, Bld: 151 mg/dL — ABNORMAL HIGH (ref 70–99)
Potassium: 3.7 mmol/L (ref 3.5–5.1)
Sodium: 135 mmol/L (ref 135–145)

## 2021-01-09 LAB — SURGICAL PATHOLOGY

## 2021-01-09 MED ORDER — DOCUSATE SODIUM 100 MG PO CAPS: 100.0000 mg | ORAL_CAPSULE | Freq: Two times a day (BID) | ORAL | Status: AC | PRN

## 2021-01-09 MED ORDER — MORPHINE SULFATE (PF) 2 MG/ML IV SOLN: 2.0000 mg | INTRAVENOUS | Status: DC | PRN

## 2021-01-09 MED ORDER — OXYCODONE HCL 5 MG PO TABS: 5.0000 mg | ORAL_TABLET | Freq: Four times a day (QID) | ORAL | 0 refills | Status: AC | PRN

## 2021-01-09 MED ORDER — OXYCODONE HCL 5 MG PO TABS: 5.0000 mg | ORAL_TABLET | Freq: Four times a day (QID) | ORAL | 0 refills | Status: DC | PRN

## 2021-01-09 NOTE — Progress Notes (Signed)
Progress Note  1 Day Post-Op  Subjective: CC: abdominal pain Abdominal soreness from surgery which is mostly around umbilical incision. Tolerating clears without nausea or emesis. Passing flatus and voiding. No BM  Son is bedside  Objective: Vital signs in last 24 hours: Temp:  [97.7 F (36.5 C)-98.8 F (37.1 C)] 98.2 F (36.8 C) (08/09 0553) Pulse Rate:  [67-82] 77 (08/09 0553) Resp:  [14-26] 18 (08/09 0553) BP: (107-139)/(68-77) 107/68 (08/09 0553) SpO2:  [92 %-100 %] 99 % (08/09 0553)    Intake/Output from previous day: 08/08 0701 - 08/09 0700 In: 2429.7 [P.O.:660; I.V.:1769.7] Out: 50 [Blood:50] Intake/Output this shift: No intake/output data recorded.  PE: General: pleasant, WD, female who is laying in bed in NAD HEENT: head is normocephalic, atraumatic. Mouth is pink and moist Heart: regular, rate, and rhythm. Palpable radial pulses  Lungs: Respiratory effort nonlabored Abd: soft, ND, +BS, expected appropriate TTP without rebound or guarding MSK: all 4 extremities are symmetrical with no cyanosis, clubbing, or edema. No calf TTP bilaterally Skin: warm and dry with no masses, lesions, or rashes Psych: A&Ox3 with an appropriate affect.    Lab Results:  Recent Labs    01/07/21 2348 01/09/21 0117  WBC 14.9* 12.5*  HGB 12.2 10.3*  HCT 35.6* 30.0*  PLT 266 239   BMET Recent Labs    01/07/21 2348 01/09/21 0117  NA 138 135  K 3.2* 3.7  CL 101 103  CO2 27 24  GLUCOSE 114* 151*  BUN 14 7*  CREATININE 0.67 0.60  CALCIUM 8.8* 8.2*   PT/INR No results for input(s): LABPROT, INR in the last 72 hours. CMP     Component Value Date/Time   NA 135 01/09/2021 0117   K 3.7 01/09/2021 0117   CL 103 01/09/2021 0117   CO2 24 01/09/2021 0117   GLUCOSE 151 (H) 01/09/2021 0117   BUN 7 (L) 01/09/2021 0117   CREATININE 0.60 01/09/2021 0117   CALCIUM 8.2 (L) 01/09/2021 0117   PROT 7.4 01/07/2021 2348   ALBUMIN 4.3 01/07/2021 2348   AST 22 01/07/2021 2348    ALT 29 01/07/2021 2348   ALKPHOS 68 01/07/2021 2348   BILITOT 0.7 01/07/2021 2348   GFRNONAA >60 01/09/2021 0117   Lipase     Component Value Date/Time   LIPASE 56 (H) 01/07/2021 2348       Studies/Results: CT ABDOMEN PELVIS W CONTRAST  Addendum Date: 01/08/2021   ADDENDUM REPORT: 01/08/2021 03:41 ADDENDUM: Correction to lower chest findings. 7 mm pulmonary nodule at the right lung base which is unchanged and therefore felt benign. Electronically Signed   By: Jasmine Pang M.D.   On: 01/08/2021 03:41   Result Date: 01/08/2021 CLINICAL DATA:  Right lower quadrant pain EXAM: CT ABDOMEN AND PELVIS WITH CONTRAST TECHNIQUE: Multidetector CT imaging of the abdomen and pelvis was performed using the standard protocol following bolus administration of intravenous contrast. CONTRAST:  OMNIPAQUE IOHEXOL 300 MG/ML  SOLN COMPARISON:  None. FINDINGS: Lower chest: No acute abnormality. Hepatobiliary: No focal liver abnormality is seen. No gallstones, gallbladder wall thickening, or biliary dilatation. Pancreas: Unremarkable. No pancreatic ductal dilatation or surrounding inflammatory changes. Spleen: Normal in size without focal abnormality. Adrenals/Urinary Tract: Adrenal glands are unremarkable. Kidneys are normal, without renal calculi, focal lesion, or hydronephrosis. Bladder is unremarkable. Stomach/Bowel: The stomach is nonenlarged. No dilated small bowel. Slight thickening of the pylorus. No colon wall thickening. Abnormal appendix measuring up to 13 mm. Considerable periappendiceal soft tissue stranding. No extraluminal  gas. Vascular/Lymphatic: Nonaneurysmal aorta.  No suspicious nodes. Reproductive: Possible small fibroid at the uterine fundus. No adnexal mass. Other: Negative for free air or free fluid Musculoskeletal: No acute or significant osseous findings. IMPRESSION: 1. Findings consistent with acute appendicitis. No definitive perforation or abscess. Appendix: Location: Right lower quadrant  Diameter: 13 mm Appendicolith: Negative Mucosal hyper-enhancement: Positive Extraluminal gas: Negative Periappendiceal collection: Negative Electronically Signed: By: Jasmine Pang M.D. On: 01/08/2021 01:11    Anti-infectives: Anti-infectives (From admission, onward)    Start     Dose/Rate Route Frequency Ordered Stop   01/08/21 0945  ceFAZolin (ANCEF) IVPB 2g/100 mL premix        2 g 200 mL/hr over 30 Minutes Intravenous  Once 01/08/21 0932 01/08/21 1115   01/08/21 0245  cefTRIAXone (ROCEPHIN) 1 g in sodium chloride 0.9 % 100 mL IVPB        1 g 200 mL/hr over 30 Minutes Intravenous  Once 01/08/21 0238 01/08/21 0418   01/08/21 0245  metroNIDAZOLE (FLAGYL) IVPB 500 mg  Status:  Discontinued        500 mg 100 mL/hr over 60 Minutes Intravenous Every 8 hours 01/08/21 0238 01/08/21 1413        Assessment/Plan  Acute appendicitis - POD1 s/p lap appy and LOA 8/8 by Dr. Dwain Sarna - OR findings of possible mass - follow surgical pathology - discussed with patient, son who was bedside, and sister via phone - tolerating clears. Advance to fulls - encouraged ambulation  FEN: FLD, SL IV ID: [email or web address] periop VTE: lovenox     LOS: 0 days    Eric Form, [facility] 01/09/2021, 8:11 AM Please see Amion for pager number during day hours 7:00am-4:30pm

## 2021-01-09 NOTE — Anesthesia Postprocedure Evaluation (Signed)
Anesthesia Post Note  Patient: Julie Terrell  Procedure(s) Performed: APPENDECTOMY LAPAROSCOPIC (Abdomen)     Patient location during evaluation: PACU Anesthesia Type: General Level of consciousness: awake and alert Pain management: pain level controlled Vital Signs Assessment: post-procedure vital signs reviewed and stable Respiratory status: spontaneous breathing, nonlabored ventilation, respiratory function stable and patient connected to nasal cannula oxygen Cardiovascular status: blood pressure returned to baseline and stable Postop Assessment: no apparent nausea or vomiting Anesthetic complications: no   No notable events documented.  Last Vitals:  Vitals:   01/09/21 0143 01/09/21 0553  BP: 123/68 107/68  Pulse: 82 77  Resp: 16 18  Temp: 37.1 C 36.8 C  SpO2: 97% 99%    Last Pain:  Vitals:   01/09/21 0700  TempSrc:   PainSc: 2                  Kory Rains L Ebonique Hallstrom

## 2021-01-09 NOTE — Progress Notes (Signed)
Discharge note  Dc instructions reviewed with patient and son. Both verbalized understanding. Belongings given to patient.

## 2021-01-10 NOTE — Discharge Summary (Signed)
[facility] Discharge Summary   Patient ID: Julie Terrell MRN: [id number] DOB/AGE: [birth year] 65 y.o.  Admit date: 01/07/2021 Discharge date: 01/09/2021  Admitting Diagnosis: Acute appendicitis  Discharge Diagnosis Acute appendicitis   Imaging: No results found.  Procedures Dr. Dwain Sarna (01/08/21) - Laparoscopic Appendectomy  Hospital Course:  65 year old otherwise healthy with right lower quadrant pain since night prior to admission. Pain was sharp, nothing making it better. No nausea, emesis, fevers.  She had elevated white blood cell count and CT with appendicitis at outside facility. Patient was admitted and underwent procedure listed above.  Tolerated procedure well and was transferred to the floor.  Diet was advanced as tolerated.  On POD1, the patient was voiding well, tolerating diet, ambulating well, pain well controlled, vital signs stable, incisions c/d/i and felt stable for discharge home.  Patient will follow up in our office in 2-3 weeks and knows to call with questions or concerns.    I or a member of my team have reviewed this patient in the Controlled Substance Database.   Allergies as of 01/09/2021   No Known Allergies      Medication List     TAKE these medications    acetaminophen 500 MG tablet Commonly known as: TYLENOL Take 500 mg by mouth every 6 (six) hours as needed for moderate pain.   docusate sodium 100 MG capsule Commonly known as: Colace Take 1 capsule (100 mg total) by mouth 2 (two) times daily as needed for up to 5 days for mild constipation or moderate constipation.   oxyCODONE 5 MG immediate release tablet Commonly known as: Oxy IR/ROXICODONE Take 1 tablet (5 mg total) by mouth every 6 (six) hours as needed for up to 3 days for moderate pain or severe pain.          Follow-up Information     [facility], [place]. Call.   Specialty: General Surgery Why: we are working to schedule your follow up appointment. we  will call with your appointment date and time Contact information: [address] [place] [address] [phone]                Signed: Clarise Cruz [facility] 01/10/2021, 1:15 PM Please see Amion for pager number during day hours 7:00am-4:30pm

## 2022-01-16 IMAGING — CT CT ABD-PELV W/ CM
2 of 5 series · 15 of 46 positions shown, 17 images · IV contrast (APPLIED)
Comparison: None.
COMPARISON: None.

Addendum:
CLINICAL DATA: Right lower quadrant pain

EXAM:
CT ABDOMEN AND PELVIS WITH CONTRAST
TECHNIQUE: Multidetector CT imaging of the abdomen and pelvis was performed
using the standard protocol following bolus administration of
intravenous contrast.
CONTRAST:  100mL OMNIPAQUE IOHEXOL 300 MG/ML  SOLN

[Series 2: abd pel w · axial · 0.59mm/px · z∈[-1105,-710]mm · 12 of 89 slices shown, 14 images]
[im 5/89  soft-tissue]
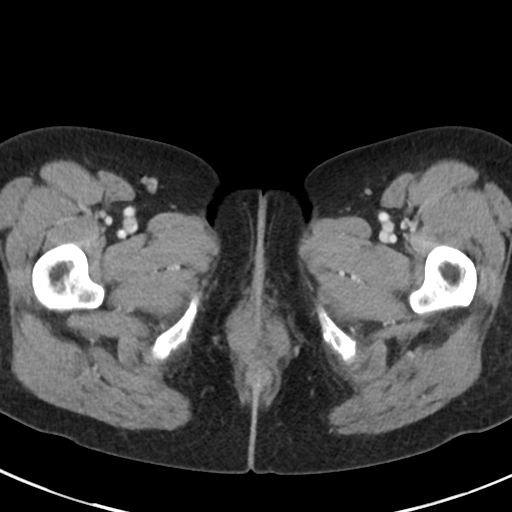
[im 5/89  bone]
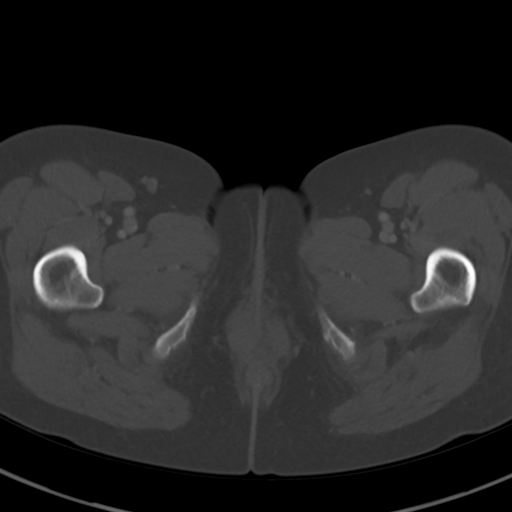
[im 14/89  soft-tissue]
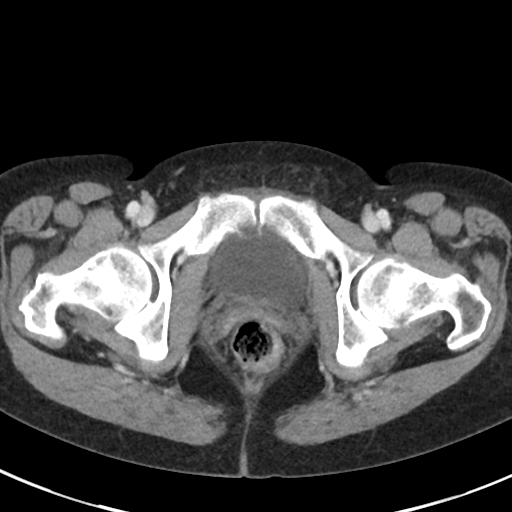
[im 19/89  soft-tissue]
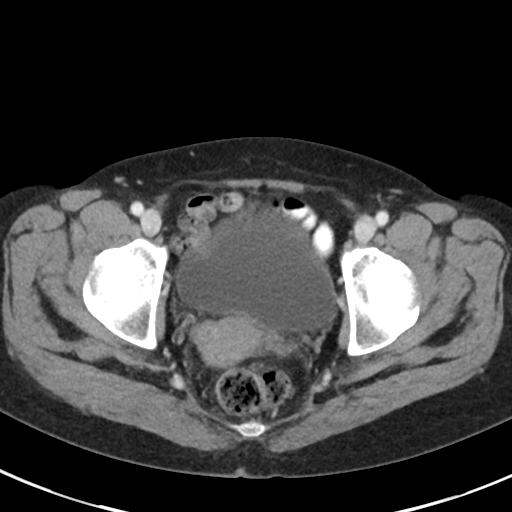
[im 28/89  soft-tissue]
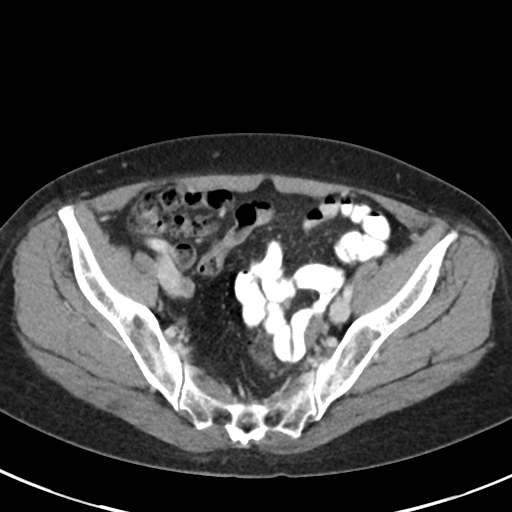
[im 33/89  soft-tissue]
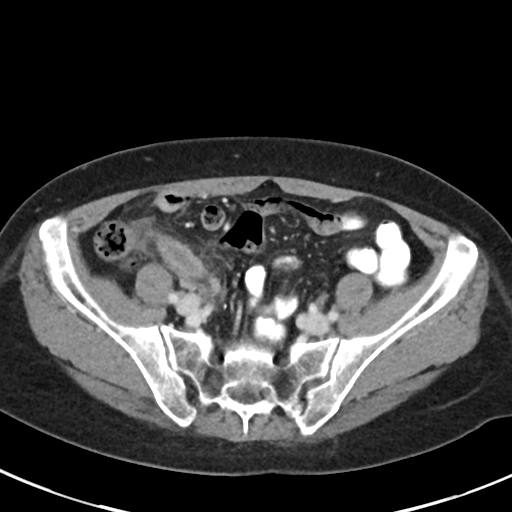
[im 42/89  soft-tissue]
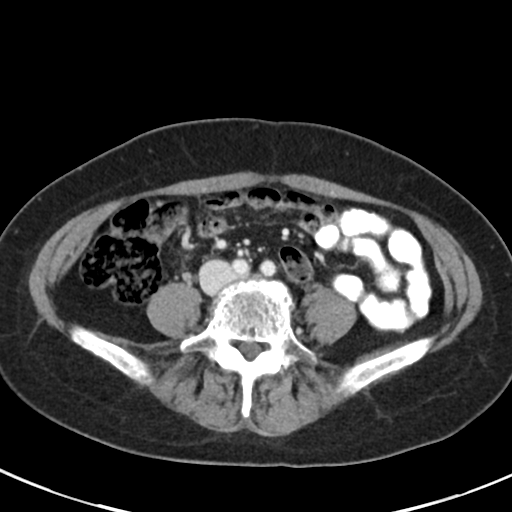
[im 47/89  soft-tissue]
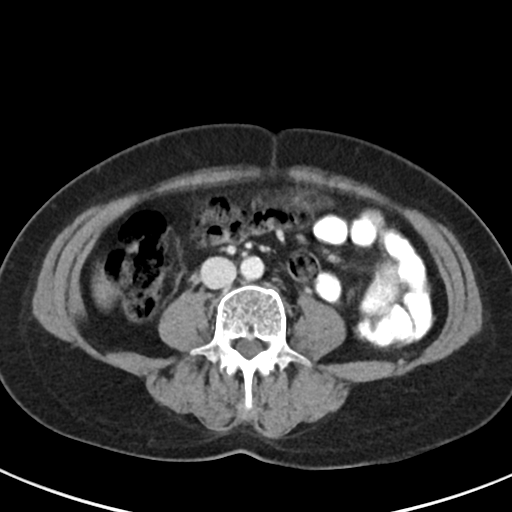
[im 56/89  soft-tissue]
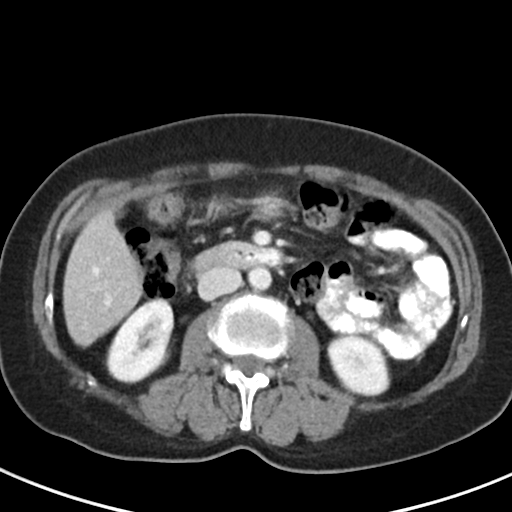
[im 61/89  soft-tissue]
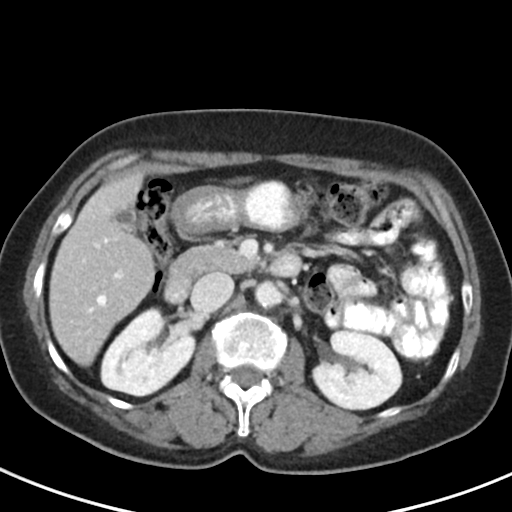
[im 61/89  bone]
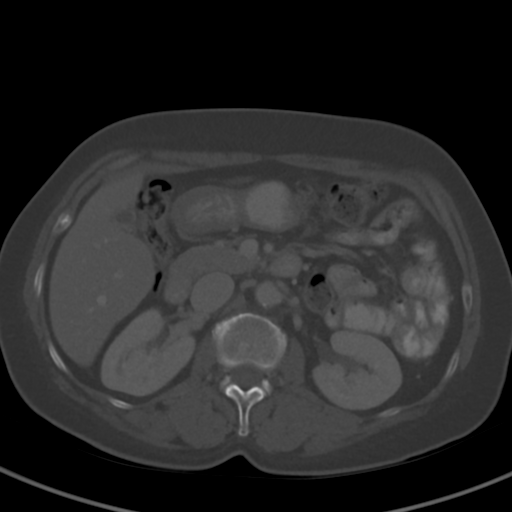
[im 70/89  soft-tissue]
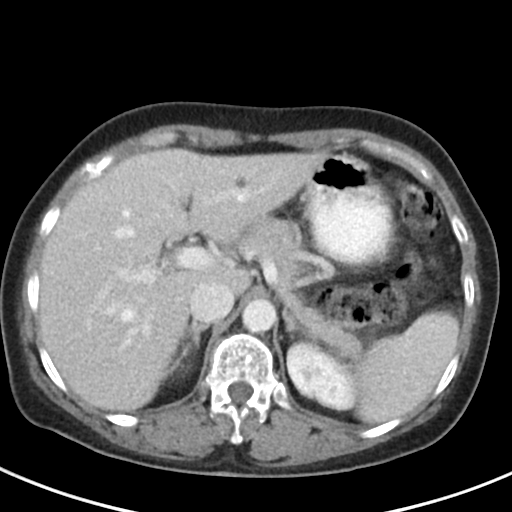
[im 75/89  soft-tissue]
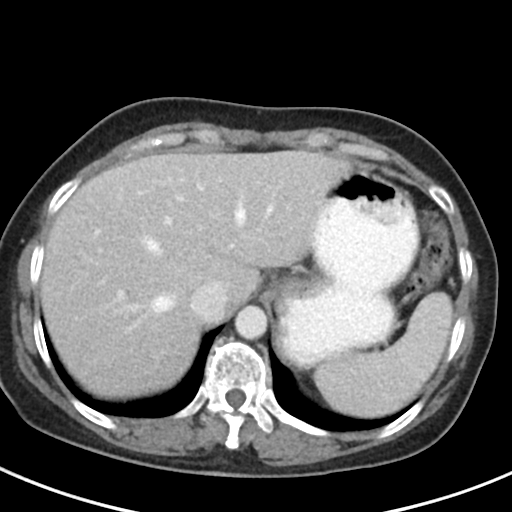
[im 84/89  soft-tissue]
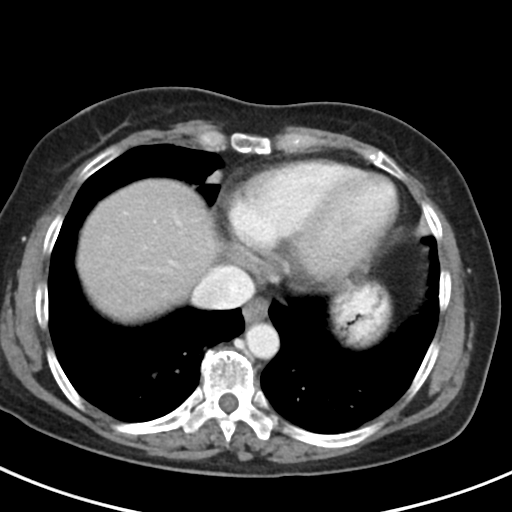

[Series 5: coronal · coronal · 0.66mm/px · 3 of 77 slices shown]
[im 26/77  soft-tissue]
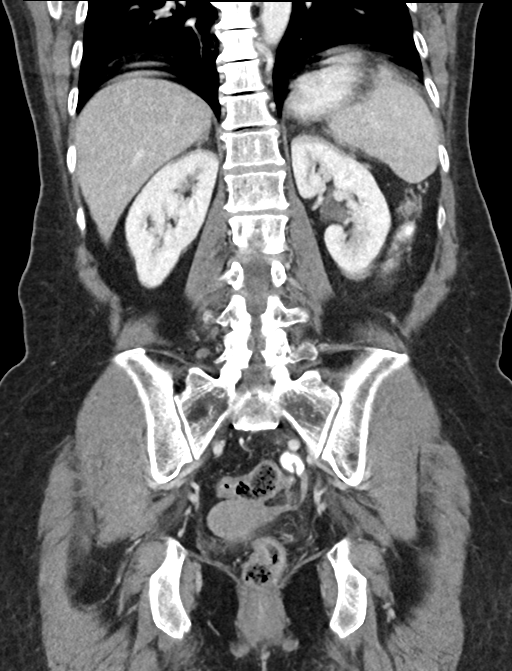
[im 34/77  soft-tissue]
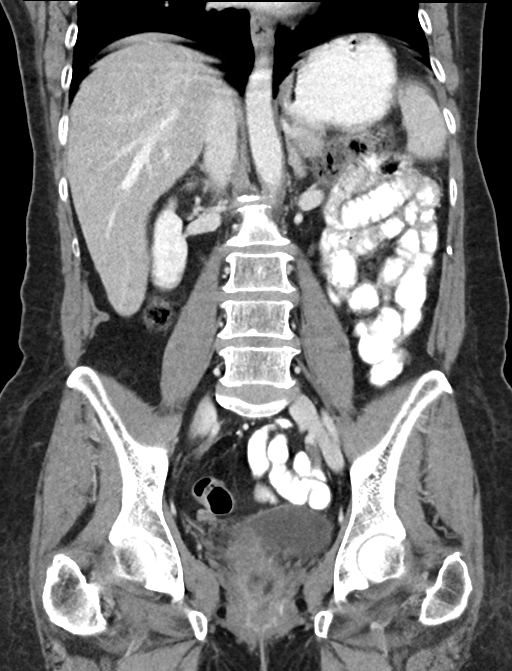
[im 43/77  soft-tissue]
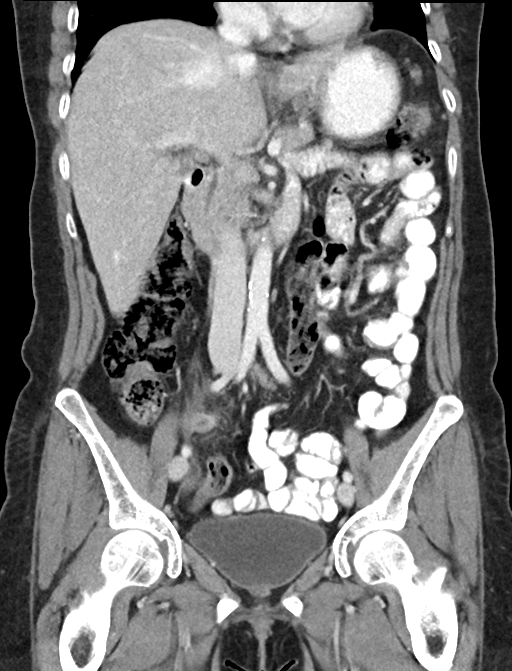

[15 of 46 positions shown; findings below may reference images not displayed]

FINDINGS: Lower chest: No acute abnormality.

Hepatobiliary: No focal liver abnormality is seen. No gallstones,
gallbladder wall thickening, or biliary dilatation.

Pancreas: Unremarkable. No pancreatic ductal dilatation or
surrounding inflammatory changes.

Spleen: Normal in size without focal abnormality.

Adrenals/Urinary Tract: Adrenal glands are unremarkable. Kidneys are
normal, without renal calculi, focal lesion, or hydronephrosis.
Bladder is unremarkable.

Stomach/Bowel: The stomach is nonenlarged. No dilated small bowel.
Slight thickening of the pylorus. No colon wall thickening. Abnormal
appendix measuring up to 13 mm. Considerable periappendiceal soft
tissue stranding. No extraluminal gas.

Vascular/Lymphatic: Nonaneurysmal aorta.  No suspicious nodes.

Reproductive: Possible small fibroid at the uterine fundus. No
adnexal mass.

Other: Negative for free air or free fluid

Musculoskeletal: No acute or significant osseous findings.
IMPRESSION: 1. Findings consistent with acute appendicitis. No definitive
perforation or abscess.

Appendix: Location: Right lower quadrant

Diameter: 13 mm

Appendicolith: Negative

Mucosal hyper-enhancement: Positive

Extraluminal gas: Negative

Periappendiceal collection: Negative

ADDENDUM:
Correction to lower chest findings. 7 mm pulmonary nodule at the
right lung base which is unchanged and therefore felt benign.

*** End of Addendum ***
FINDINGS: Lower chest: No acute abnormality.

Hepatobiliary: No focal liver abnormality is seen. No gallstones,
gallbladder wall thickening, or biliary dilatation.

Pancreas: Unremarkable. No pancreatic ductal dilatation or
surrounding inflammatory changes.

Spleen: Normal in size without focal abnormality.

Adrenals/Urinary Tract: Adrenal glands are unremarkable. Kidneys are
normal, without renal calculi, focal lesion, or hydronephrosis.
Bladder is unremarkable.

Stomach/Bowel: The stomach is nonenlarged. No dilated small bowel.
Slight thickening of the pylorus. No colon wall thickening. Abnormal
appendix measuring up to 13 mm. Considerable periappendiceal soft
tissue stranding. No extraluminal gas.

Vascular/Lymphatic: Nonaneurysmal aorta.  No suspicious nodes.

Reproductive: Possible small fibroid at the uterine fundus. No
adnexal mass.

Other: Negative for free air or free fluid

Musculoskeletal: No acute or significant osseous findings.
IMPRESSION: 1. Findings consistent with acute appendicitis. No definitive
perforation or abscess.

Appendix: Location: Right lower quadrant

Diameter: 13 mm

Appendicolith: Negative

Mucosal hyper-enhancement: Positive

Extraluminal gas: Negative

Periappendiceal collection: Negative

## 2022-10-01 ENCOUNTER — Other Ambulatory Visit: Payer: Self-pay

## 2022-10-01 ENCOUNTER — Emergency Department (HOSPITAL_BASED_OUTPATIENT_CLINIC_OR_DEPARTMENT_OTHER)
Admission: EM | Admit: 2022-10-01 | Discharge: 2022-10-01 | Disposition: A | Discharge status: Home / Self Care | Payer: BC Managed Care – PPO | Attending: Emergency Medicine | Admitting: Emergency Medicine

## 2022-10-01 ENCOUNTER — Emergency Department (HOSPITAL_BASED_OUTPATIENT_CLINIC_OR_DEPARTMENT_OTHER): Payer: BC Managed Care – PPO

## 2022-10-01 DIAGNOSIS — R1031 Right lower quadrant pain: Secondary | ICD-10-CM | POA: Insufficient documentation

## 2022-10-01 DIAGNOSIS — R109 Unspecified abdominal pain: Secondary | ICD-10-CM

## 2022-10-01 LAB — COMPREHENSIVE METABOLIC PANEL
ALT: 14 U/L (ref 0–44)
AST: 17 U/L (ref 15–41)
Albumin: 4.1 g/dL (ref 3.5–5.0)
Alkaline Phosphatase: 55 U/L (ref 38–126)
Anion gap: 8 (ref 5–15)
BUN: 13 mg/dL (ref 8–23)
CO2: 26 mmol/L (ref 22–32)
Calcium: 8.9 mg/dL (ref 8.9–10.3)
Chloride: 105 mmol/L (ref 98–111)
Creatinine, Ser: 0.7 mg/dL (ref 0.44–1.00)
GFR, Estimated: >60 mL/min (ref >60)
Glucose, Bld: 87 mg/dL (ref 70–99)
Potassium: 3.9 mmol/L (ref 3.5–5.1)
Sodium: 139 mmol/L (ref 135–145)
Total Bilirubin: 0.5 mg/dL (ref 0.3–1.2)
Total Protein: 7.4 g/dL (ref 6.5–8.1)

## 2022-10-01 LAB — CBC
HCT: 36.2 % (ref 36.0–46.0)
Hemoglobin: 12.3 g/dL (ref 12.0–15.0)
MCH: 25.1 pg — ABNORMAL LOW (ref 26.0–34.0)
MCHC: 34 g/dL (ref 30.0–36.0)
MCV: 73.9 fL — ABNORMAL LOW (ref 80.0–100.0)
Platelets: 253 10*3/uL (ref 150–400)
RBC: 4.9 MIL/uL (ref 3.87–5.11)
RDW: 14.4 % (ref 11.5–15.5)
WBC: 6.5 10*3/uL (ref 4.0–10.5)
nRBC: 0 % (ref 0.0–0.2)

## 2022-10-01 LAB — URINALYSIS, ROUTINE W REFLEX MICROSCOPIC
Bilirubin Urine: NEGATIVE
Glucose, UA: NEGATIVE mg/dL
Hgb urine dipstick: NEGATIVE
Ketones, ur: NEGATIVE mg/dL
Leukocytes,Ua: NEGATIVE
Nitrite: NEGATIVE
Protein, ur: NEGATIVE mg/dL
Specific Gravity, Urine: 1.019 (ref 1.005–1.030)
pH: 5 (ref 5.0–8.0)

## 2022-10-01 LAB — LIPASE, BLOOD: Lipase: 37 U/L (ref 11–51)

## 2022-10-01 MED ORDER — IBUPROFEN 600 MG PO TABS: 600.0000 mg | ORAL_TABLET | Freq: Four times a day (QID) | ORAL | 0 refills | Status: AC | PRN

## 2022-10-01 MED ORDER — KETOROLAC TROMETHAMINE 15 MG/ML IJ SOLN
15.0000 mg | Freq: Once | INTRAMUSCULAR | Status: AC
  Administered 2022-10-01: 15 mg via INTRAVENOUS
  Filled 2022-10-01: qty 1

## 2022-10-01 MED ORDER — IOHEXOL 300 MG/ML  SOLN
100.0000 mL | Freq: Once | INTRAMUSCULAR | Status: AC | PRN
  Administered 2022-10-01: 75 mL via INTRAVENOUS

## 2022-10-01 NOTE — ED Triage Notes (Signed)
Patient here POV from Home.  Endorses RLQ ABD Pain that radiates to right Hip for 1 Week. No N/V/D. Noted some Difficulty Urinating. No Hematuria.   NAD noted during triage. A&Ox4. Gcs 15. Ambulatory.

## 2022-10-01 NOTE — ED Provider Notes (Signed)
[place] EMERGENCY DEPARTMENT AT [facility] Provider Note   CSN: [id number] Arrival date & time: 10/01/22  1402     History  Chief Complaint  Patient presents with   Abdominal Pain    Julie Terrell is a 67 y.o. female.   Abdominal Pain   67 year old female presents emergency department with complaints of right lower quadrant abdominal pain.  She reports some radiation to her right hip as well as her right low back.  States the pain is not present for the past week.  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  Denies fever, chills, night sweats, chest pain, shortness of breath, nausea, vomiting, vaginal symptoms, change in bowel habits.  Patient has reported some difficulty urinating but states this is been present for the past few years and remains unchanged.  Denies dysuria, hematuria, hematochezia/melena.  Last bowel movement this morning and regular per patient.  States she had appendectomy around a year ago and is concerned about possible complications with prior surgery.  Past medical history significant for appendicitis with appendectomy  Home Medications Prior to Admission medications   Medication Sig Start Date End Date Taking? Authorizing Provider  ibuprofen (ADVIL) 600 MG tablet Take 1 tablet (600 mg total) by mouth every 6 (six) hours as needed. 10/01/22  Yes Sherian Maroon A, PA  acetaminophen (TYLENOL) 500 MG tablet Take 500 mg by mouth every 6 (six) hours as needed for moderate pain.    [provider]      Allergies    Patient has no known allergies.    Review of Systems   Review of Systems  Gastrointestinal:  Positive for abdominal pain.  All other systems reviewed and are negative.   Physical Exam Updated Vital Signs BP 124/63   Pulse 82   Temp 97.7 F (36.5 C) (Oral)   Resp 17   SpO2 98%  Physical  Exam Vitals and nursing note reviewed.  Constitutional:      General: She is not in acute distress.    Appearance: She is well-developed.  HENT:     Head: Normocephalic and atraumatic.  Eyes:     Conjunctiva/sclera: Conjunctivae normal.  Cardiovascular:     Rate and Rhythm: Normal rate and regular rhythm.     Heart sounds: No murmur heard. Pulmonary:     Effort: Pulmonary effort is normal. No respiratory distress.     Breath sounds: Normal breath sounds.  Abdominal:     Palpations: Abdomen is soft.     Tenderness: There is abdominal tenderness in the right lower quadrant.     Comments: Possible right-sided CVA tenderness versus overlying muscular tenderness.  Musculoskeletal:        General: No swelling.     Cervical back: Neck supple.     Right lower leg: No edema.     Left lower leg: No edema.     Comments: No midline tenderness of cervical, thoracic, lumbar spine with no step-off reported noted.  Paraspinal tenderness noted in the right lumbar region.  Skin:    General: Skin is warm and dry.     Capillary Refill: Capillary refill takes less than 2 seconds.     Comments: No appreciable rash right flank area.  Neurological:     Mental Status: She is alert.  Psychiatric:        Mood and  Affect: Mood normal.     ED Results / Procedures / Treatments   Labs (all labs ordered are listed, but only abnormal results are displayed) Labs Reviewed  CBC - Abnormal; Notable for the following components:      Result Value   MCV 73.9 (*)    MCH 25.1 (*)    All other components within normal limits  LIPASE, BLOOD  COMPREHENSIVE METABOLIC PANEL  URINALYSIS, ROUTINE W REFLEX MICROSCOPIC    EKG None  Radiology CT ABDOMEN PELVIS W CONTRAST  Result Date: 10/01/2022 CLINICAL DATA:  Right lower quadrant pain radiating to the hip. EXAM: CT ABDOMEN AND PELVIS WITH CONTRAST TECHNIQUE: Multidetector CT imaging of the abdomen and pelvis was performed using the standard protocol following  bolus administration of intravenous contrast. RADIATION DOSE REDUCTION: This exam was performed according to the departmental dose-optimization program which includes automated exposure control, adjustment of the mA and/or kV according to patient size and/or use of iterative reconstruction technique. CONTRAST:  75mL OMNIPAQUE IOHEXOL 300 MG/ML  SOLN COMPARISON:  CT abdomen and pelvis 01/08/2021 FINDINGS: Lower chest: No acute abnormality. Hepatobiliary: Rounded hypodensity in the left lobe of the liver is too small to characterize and unchanged, favored as a cyst or hemangioma. No new liver lesions are seen. No gallstones, gallbladder wall thickening, or biliary dilatation. Pancreas: Unremarkable. No pancreatic ductal dilatation or surrounding inflammatory changes. Spleen: Normal in size without focal abnormality. Adrenals/Urinary Tract: Adrenal glands are unremarkable. Kidneys are normal, without renal calculi, focal lesion, or hydronephrosis. Bladder is unremarkable. Stomach/Bowel: Stomach is within normal limits. Appendix is surgically absent. No evidence of bowel wall thickening, distention, or inflammatory changes. Vascular/Lymphatic: Aortic atherosclerosis. No enlarged abdominal or pelvic lymph nodes. Reproductive: Uterus and bilateral adnexa are unremarkable. Other: No abdominal wall hernia or abnormality. No abdominopelvic ascites. Musculoskeletal: No acute or significant osseous findings. IMPRESSION: 1. No acute findings in the abdomen or pelvis. Aortic Atherosclerosis (ICD10-I70.0). Electronically Signed   By: Darliss Cheney M.D.   On: 10/01/2022 15:49    Procedures Procedures    Medications Ordered in ED Medications  ketorolac (TORADOL) 15 MG/ML injection 15 mg (15 mg Intravenous Given 10/01/22 1450)  iohexol (OMNIPAQUE) 300 MG/ML solution 100 mL (75 mLs Intravenous Contrast Given 10/01/22 1534)    ED Course/ Medical Decision Making/ A&P                             Medical Decision  Making Amount and/or Complexity of Data Reviewed Labs: ordered. Radiology: ordered.  Risk Prescription drug management.   This patient presents to the ED for concern of abdominal pain, this involves an extensive number of treatment options, and is a complaint that carries with it a high risk of complications and morbidity.  The differential diagnosis includes pancreatitis, gastritis, GERD, CBD pathology, cholecystitis, hepatitis, SBO/LBO, volvulus, diverticulitis, appendicitis, pyelonephritis, cystitis, nephrolithiasis, mesenteric, aortic dissection, aortic aneurysm    Co morbidities that complicate the patient evaluation  See HPI  Additional history obtained:  Additional history obtained from EMR External records from outside source obtained and reviewed including hospital records   Lab Tests:  I Ordered, and personally interpreted labs.  The pertinent results include: No leukocytosis noted.  No evidence anemia.  Platelets within range.  No electrolyte imbalance.  No transaminitis.  No renal dysfunction.  Lipase within normal limits.  UA without abnormality.   Imaging Studies ordered:  I ordered imaging studies including CT abdomen I independently visualized and  interpreted imaging which showed no acute abnormality.  Aortic atherosclerosis I agree with the radiologist interpretation   Cardiac Monitoring: / EKG:  The patient was maintained on a cardiac monitor.  I personally viewed and interpreted the cardiac monitored which showed an underlying rhythm of: Sinus rhythm   Consultations Obtained:  N/a   Problem List / ED Course / Critical interventions / Medication management  Right flank pain I ordered medication including Toradol   Reevaluation of the patient after these medicines showed that the patient improved I have reviewed the patients home medicines and have made adjustments as needed   Social Determinants of Health:  Denies tobacco, illicit drug  use.   Test / Admission - Considered:  Right flank pain Vitals signs within normal range and stable throughout visit. Laboratory/imaging studies significant for: See above 67 year old female presents emergency department with 1 week of right-sided flank pain.  Pain seems to be worsened with bending over, standing up and twisting of abdomen.  CT imaging negative for any acute abnormality.  Patient overall well-appearing, afebrile in no acute distress, tolerating p.o. without difficulty.  Symptoms seem most likely secondary to muscular etiology given reported mechanism of injury with only worsening with movement and patient without GI related symptoms.  Patient noted significant improvement with administration of Toradol in the emergency department.  Will recommend nonsteroidal medication outpatient for treatment of pain.  Patient recommended follow-up with primary care for reassessment.  Treatment plan discussed at length with patient and she acknowledged understanding was agreeable to said plan. Worrisome signs and symptoms were discussed with the patient, and the patient acknowledged understanding to return to the ED if noticed. Patient was stable upon discharge.          Final Clinical Impression(s) / ED Diagnoses Final diagnoses:  Right flank pain    Rx / DC Orders ED Discharge Orders          Ordered    ibuprofen (ADVIL) 600 MG tablet  Every 6 hours PRN        10/01/22 1600              Peter Garter, [place] 10/01/22 1700    Ernie Avena, MD 10/01/22 2015

## 2022-10-01 NOTE — Discharge Instructions (Signed)
The workup today was overall reassuring.  Appendix and other intra-abdominal organs appear well.  Most likely symptoms related to muscular injury given the repetitive motion performed at work.  Recommend medication in the form of ibuprofen as well as rest from repetitive exercise.  Recommend follow-up primary care for reassessment of your symptoms.  Please do not hesitate to return to the emergency department the worrisome signs and symptoms we discussed become apparent.

## 2022-10-01 NOTE — ED Notes (Signed)
RN reviewed discharge instructions with pt. Pt verbalized understanding and had no further questions. VSS upon discharge.  

## 2022-10-01 NOTE — ED Notes (Signed)
Patient transported to CT
